# Patient Record
Sex: Female | Born: 1975 | Race: White | Hispanic: No | Marital: Married | Smoking: Never smoker
Health system: Southern US, Community
[De-identification: ages and names within clinical notes are randomized; demographics above are authoritative.]

## PROBLEM LIST (undated history)

## (undated) DIAGNOSIS — F419 Anxiety disorder, unspecified: Secondary | ICD-10-CM

## (undated) DIAGNOSIS — R51 Headache: Secondary | ICD-10-CM

## (undated) DIAGNOSIS — M545 Low back pain, unspecified: Secondary | ICD-10-CM

## (undated) DIAGNOSIS — F32A Depression, unspecified: Secondary | ICD-10-CM

## (undated) DIAGNOSIS — C801 Malignant (primary) neoplasm, unspecified: Secondary | ICD-10-CM

## (undated) DIAGNOSIS — G629 Polyneuropathy, unspecified: Secondary | ICD-10-CM

## (undated) HISTORY — DX: Low back pain, unspecified: M54.50

## (undated) HISTORY — DX: Headache: R51

## (undated) HISTORY — DX: Malignant (primary) neoplasm, unspecified: C80.1

## (undated) HISTORY — DX: Low back pain: M54.5

## (undated) HISTORY — DX: Polyneuropathy, unspecified: G62.9

## (undated) HISTORY — DX: Depression, unspecified: F32.A

## (undated) HISTORY — DX: Anxiety disorder, unspecified: F41.9

---

## 2001-01-06 ENCOUNTER — Other Ambulatory Visit: Admission: RE | Admit: 2001-01-06 | Discharge: 2001-01-06 | Payer: Self-pay | Admitting: Obstetrics and Gynecology

## 2002-02-03 ENCOUNTER — Other Ambulatory Visit: Admission: RE | Admit: 2002-02-03 | Discharge: 2002-02-03 | Payer: Self-pay | Admitting: Obstetrics and Gynecology

## 2003-03-24 ENCOUNTER — Other Ambulatory Visit: Admission: RE | Admit: 2003-03-24 | Discharge: 2003-03-24 | Payer: Self-pay | Admitting: Obstetrics and Gynecology

## 2004-05-29 ENCOUNTER — Inpatient Hospital Stay (HOSPITAL_COMMUNITY): Admission: AD | Admit: 2004-05-29 | Discharge: 2004-06-02 | Payer: Self-pay | Admitting: Obstetrics and Gynecology

## 2004-07-15 ENCOUNTER — Other Ambulatory Visit: Admission: RE | Admit: 2004-07-15 | Discharge: 2004-07-15 | Payer: Self-pay | Admitting: Obstetrics & Gynecology

## 2008-01-19 ENCOUNTER — Ambulatory Visit: Payer: Self-pay | Admitting: Family Medicine

## 2008-01-19 DIAGNOSIS — M542 Cervicalgia: Secondary | ICD-10-CM | POA: Insufficient documentation

## 2008-03-08 ENCOUNTER — Ambulatory Visit: Payer: Self-pay | Admitting: Family Medicine

## 2008-03-08 DIAGNOSIS — J019 Acute sinusitis, unspecified: Secondary | ICD-10-CM | POA: Insufficient documentation

## 2008-03-08 DIAGNOSIS — B358 Other dermatophytoses: Secondary | ICD-10-CM | POA: Insufficient documentation

## 2008-03-08 DIAGNOSIS — R21 Rash and other nonspecific skin eruption: Secondary | ICD-10-CM | POA: Insufficient documentation

## 2008-03-08 LAB — CONVERTED CEMR LAB: Rapid Strep: NEGATIVE

## 2008-04-10 ENCOUNTER — Ambulatory Visit (HOSPITAL_COMMUNITY): Admission: RE | Admit: 2008-04-10 | Discharge: 2008-04-10 | Payer: Self-pay | Admitting: Obstetrics and Gynecology

## 2009-10-08 ENCOUNTER — Ambulatory Visit: Payer: Self-pay | Admitting: Sports Medicine

## 2009-10-08 DIAGNOSIS — M545 Low back pain, unspecified: Secondary | ICD-10-CM | POA: Insufficient documentation

## 2009-10-08 DIAGNOSIS — R5381 Other malaise: Secondary | ICD-10-CM

## 2009-10-08 DIAGNOSIS — R5383 Other fatigue: Secondary | ICD-10-CM | POA: Insufficient documentation

## 2009-10-08 DIAGNOSIS — M79605 Pain in left leg: Secondary | ICD-10-CM

## 2009-10-08 DIAGNOSIS — M5416 Radiculopathy, lumbar region: Secondary | ICD-10-CM

## 2009-10-29 ENCOUNTER — Encounter: Admission: RE | Admit: 2009-10-29 | Discharge: 2009-12-03 | Payer: Self-pay | Admitting: Sports Medicine

## 2009-10-29 ENCOUNTER — Encounter: Payer: Self-pay | Admitting: Sports Medicine

## 2009-11-20 ENCOUNTER — Encounter: Payer: Self-pay | Admitting: Sports Medicine

## 2009-12-12 ENCOUNTER — Encounter (INDEPENDENT_AMBULATORY_CARE_PROVIDER_SITE_OTHER): Payer: Self-pay | Admitting: *Deleted

## 2010-03-12 ENCOUNTER — Ambulatory Visit
Admission: RE | Admit: 2010-03-12 | Discharge: 2010-03-12 | Payer: Self-pay | Source: Home / Self Care | Attending: Sports Medicine | Admitting: Sports Medicine

## 2010-04-02 NOTE — Letter (Signed)
Summary: [facility] PT Referral Form  [facility] PT Referral Form   Imported By: Marily Memos 10/08/2009 11:03:32  _____________________________________________________________________  External Attachment:    Type:   Image     Comment:   External Document

## 2010-04-02 NOTE — Miscellaneous (Signed)
Summary: [facility]  [facility]   Imported By: Marily Memos 12/04/2009 09:39:44  _____________________________________________________________________  External Attachment:    Type:   Image     Comment:   External Document

## 2010-04-02 NOTE — Assessment & Plan Note (Signed)
Summary: BACK INJURY,MC   Vital Signs:  Patient profile:   35 year old female Height:      66.5 inches Weight:      180 pounds BMI:     28.72 BP sitting:   113 / 77  Vitals Entered By: Lillia Pauls CMA (October 08, 2009 9:24 AM)  History of Present Illness: Ms. Sandra Rocha is a 35 year old female who  presents today with a 2 year history of lower back pain, which has gotten significantly worse in the past six months. She recalls an inciting event, in which she had to throw her dog over a fance, which caused significant low back pain. She describes her current pain as located in central lower back, with shooting pains radiating into the buttocks and posterior thigh.  Her pain is worse when sitting and laying on her stomach, and is often worse in the morning. She gets some relief from her chiropractor, ice, and Alieve. She reports numbness to touch of the left great toe, without any other parasthesias or weakness. She deneis any changes in bowel or bladder continence.  Of note, Ms. Vidrio has had a stressful year following the birth of her son (pregancy compliated by pre-eclampsia) who has Beckwith-Wiedmann syndrome, but is doing relatively well. She works as a IT trainer at [employer], so is seated for most of the day. She is trying to increase her physical activity to lose weight and is run/walking about 5-6 miles per week.  Allergies: No Known Drug Allergies  Physical Exam  General:  Well-developed female in no actue distress who moves slowly and with great intention. Msk:  Back: Tender to palpation in the low back over the spine and the paraspinal muscles. ROM: Full flexion, extension, rotation, and lateral bending with some pain on extension. Straight leg raise induces mild right sided back pain. Pearlean Brownie test reveals tightness without pain. SI joints move symmetrically with alternating pressure.  Hip: ABuctors 4/5 bilaterally. Left leg shows slight weakness compared to right on hip flexion and knee  extension. Patient is able to walk on toes and heels with no obvous differences between right and left.  Running and walking gait are normal, no trendelenburg, with obvious guarding of the pelvis and low-back from motion. Neurologic:  decreased sensation to light touch over left great toe, with normal gross sensation through legs.  Patellar and Achilles reflexes are 2+ and symmetrical bilaterally.   Impression & Recommendations:  Problem # 1:  LUMBAGO (ICD-724.2) Ms. Hentz presents with symptoms of chronic low back pain and clinical signs that are concerning for a discogenic etiology. As the patient is not having significant neuropathy at this point, surgery is not a consideration so we will refrain from imaging at this point. Instead, the patient can use Mobic for pain control, flexaril at night to relax her paraspinal muscles and allow her to rest, and will start physical therapy to help strengthen her muscles and return to comfortable physical activity. Her updated medication list for this problem includes:    Flexeril 5 Mg Tabs (Cyclobenzaprine hcl) .Marland Kitchen... Take 1-2 tabs by mouth at bedtime if needed    Mobic 7.5 Mg Tabs (Meloxicam) .Marland Kitchen... 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 We will address the pain with Mobic, and flexaril for muscle relaxation and sleep. The patient will also begin twice weekly physical therapy to improve her muscle strength to support the spine. The patient is to return to clinic in 6 weeks to re-evaluate.  Problem # 3:  WEAKNESS (ICD-780.79) The patient has a slight left sided hip and leg weakness that may be related to neuropathy or to changing activity patterns given pack  pain. The patient is to begin physical therapy twice per week to work to strengthen these muscles symmetrically. She will return to clinic after 6 weeks of PT to evaluate for improvement.  Complete Medication List: 1)  Flexeril 5 Mg Tabs (Cyclobenzaprine hcl) .... Take 1-2 tabs by mouth at bedtime if needed 2)  Aleve Cold & Sinus 120-220 Mg Xr12h-tab (Pseudoephedrine-naproxen na) 3)  Vicks Nyquil Multi-symptom 15-6.25-325 Mg Caps (Dm-doxylamine-acetaminophen) .... As directed 4)  Prenavite Multiple Vitamin 28-0.8 Mg Tabs (Prenatal vit-fe fumarate-fa) .Marland Kitchen.. 1 qd 5)  Cvs Magnesium 250 Mg Tabs (Magnesium) .Marland Kitchen.. 1 qd 6)  Vitamin C 250 Mg Tabs (Ascorbic acid) .Marland Kitchen.. 1 qd 7)  Augmentin 875-125 Mg Tabs (Amoxicillin-pot clavulanate) .Marland Kitchen.. 1 by mouth twice a day 8)  Nasacort Aq 55 Mcg/act Aers (Triamcinolone acetonide(nasal)) .Marland Kitchen.. 1-2 spr once daily 9)  Ayr Saline Nasal Drops 0.65 % Soln (Saline) .... As directed 10)  Zyrtec-d Allergy & Congestion 5-120 Mg Xr12h-tab (Cetirizine-pseudoephedrine) .Marland Kitchen.. 1 by mouth twice aday 11)  Diflucan 150 Mg Tabs (Fluconazole) .... Once daily use as needed 12)  Ketoconazole 2 % Crea (Ketoconazole) .... Apply to area twice a day  for 10 -14 days 13)  Maxair Autohaler 200 Mcg/inh Aerb (Pirbuterol acetate) .... 2 puffs q 6hrs as needed for sob 14)  Mobic 7.5 Mg Tabs (Meloxicam) .Marland Kitchen.. 1 by mouth qd Prescriptions: MOBIC 7.5 MG TABS (MELOXICAM) 1 by mouth qd  #30 x 3   Entered and Authorized by:   Enid Baas MD   Signed by:   Enid Baas MD on 10/08/2009   Method used:   Electronically to        [facility]* (retail)       [address]       [address] Shipping/mailing       [place], [place]  [address]       Ph: [phone]       Fax: [phone]   RxID:   [id number] FLEXERIL 5 MG TABS (CYCLOBENZAPRINE HCL) take 1-2 tabs by mouth at bedtime if needed  #60 x 3   Entered and Authorized by:   Enid Baas MD   Signed by:   Enid Baas MD on  10/08/2009   Method used:   Electronically to        [facility]* (retail)       [address]       [address] Shipping/mailing       [place], [place]  [address]       Ph: [phone]       Fax: [phone]   RxID:   [id number]

## 2010-04-02 NOTE — Miscellaneous (Signed)
Summary: Tramadol refill  Clinical Lists Changes  Medications: Rx of TRAMADOL HCL 50 MG TABS (TRAMADOL HCL) take one tab q 4-6 hours as needed pain;  #120 x 1;  Signed;  Entered by: Rochele Pages RN;  Authorized by: Enid Baas MD;  Method used: Electronically to [facility]*, [address], [address] Shipping/mailing, [place], [place]  [address], Ph: [phone], Fax: [phone]    Prescriptions: TRAMADOL HCL 50 MG TABS (TRAMADOL HCL) take one tab q 4-6 hours as needed pain  #120 x 1   Entered by:   Rochele Pages RN   Authorized by:   Enid Baas MD   Signed by:   Rochele Pages RN on 12/12/2009   Method used:   Electronically to        [facility]* (retail)       [address]       [address] Shipping/mailing       [place], [place]  [address]       Ph: [phone]       Fax: [phone]   RxID:   [id number]  Per Dr. Darrick Penna refilled.  Pt notified.  Rochele Pages RN  December 12, 2009 2:09 PM

## 2010-04-02 NOTE — Miscellaneous (Signed)
Summary: [facility]  [facility]   Imported By: Marily Memos 10/31/2009 08:52:22  _____________________________________________________________________  External Attachment:    Type:   Image     Comment:   External Document

## 2010-04-02 NOTE — Miscellaneous (Signed)
Summary: [facility]  [facility]   Imported By: Marily Memos 12/04/2009 09:40:27  _____________________________________________________________________  External Attachment:    Type:   Image     Comment:   External Document

## 2010-04-04 NOTE — Assessment & Plan Note (Signed)
Summary: F/U [facility]   Vital Signs:  Patient profile:   35 year old female Pulse rate:   98 / minute BP sitting:   110 / 74  (right arm)  Vitals Entered By: Rochele Pages RN (March 12, 2010 9:02 AM) CC: f/u low back pain   CC:  f/u low back pain.  History of Present Illness: Pt presents to clinic for f/u of low back pain which she reports is approx 25% improved.  States she has days where pain is improved, and some days it is about the same.  After completing PT sessions her pain showed great improvement, but is worsening again now that she has started to increase her running again.  She is up to 3-4 miles per week.  States she does not have back pain while running, but has it before and after runs.  She continues to have constant numbness in her L foot and great toe, this symptom never improved.  She takes 1-2 flexeril at bedtime, and tramadol which is helpful, she reports needing to take the max dose some days.    Preventive Screening-Counseling & Management  Alcohol-Tobacco     Smoking Status: never  Allergies (verified): No Known Drug Allergies  Social History: Smoking Status:  never  Physical Exam  General:  Well-developed,well-nourished,in no acute distress; alert,appropriate and cooperative throughout examination Msk:  FABER tight on Rt FABER on lt normal SI joint movement normal Straight leg raise neg Hip flexion tight on left, normal on rt Hip abduction strong on rt and lt Lumbar extension 20 degrees Lumbar flexion normal Rotation and lateral bending normal Toe walk normal Heel walk normal Tandum walk without problems Weak on great toe flexion on Lt Slightly weak on great toe extension on Lt     Impression & Recommendations:  Problem # 1:  LUMBAGO (ICD-724.2)  Her updated medication list for this problem includes:    Flexeril 5 Mg Tabs (Cyclobenzaprine hcl) .Marland Kitchen... Take 1-2 tabs by mouth at bedtime if needed    Mobic 7.5 Mg Tabs (Meloxicam) .Marland Kitchen... 1 by  mouth qd    Tramadol Hcl 50 Mg Tabs (Tramadol hcl) .Marland Kitchen... 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 Take 1-2 tabs by mouth at bedtime if needed 2)  Aleve Cold & Sinus 120-220 Mg Xr12h-tab (Pseudoephedrine-naproxen na) 3)  Vicks Nyquil Multi-symptom 15-6.25-325 Mg Caps (Dm-doxylamine-acetaminophen) .... As directed 4)  Prenavite Multiple Vitamin 28-0.8 Mg Tabs (Prenatal vit-fe fumarate-fa) .Marland Kitchen.. 1 qd 5)  Cvs Magnesium 250 Mg Tabs (Magnesium) .Marland Kitchen.. 1 qd 6)  Vitamin C 250 Mg Tabs (Ascorbic acid) .Marland Kitchen.. 1 qd 7)  Augmentin 875-125 Mg Tabs (Amoxicillin-pot clavulanate) .Marland Kitchen.. 1 by mouth twice a day 8)  Nasacort Aq 55 Mcg/act Aers (Triamcinolone acetonide(nasal)) .Marland Kitchen.. 1-2 spr once daily 9)  Ayr Saline Nasal Drops 0.65 % Soln (Saline) .... As directed 10)  Zyrtec-d Allergy & Congestion 5-120 Mg Xr12h-tab (Cetirizine-pseudoephedrine) .Marland Kitchen.. 1 by mouth twice aday 11)  Diflucan 150 Mg Tabs (Fluconazole) .... Once daily use as needed 12)  Ketoconazole 2 % Crea (Ketoconazole) .... Apply to area  twice a day  for 10 -14 days 13)  Maxair Autohaler 200 Mcg/inh Aerb (Pirbuterol acetate) .... 2 puffs q 6hrs as needed for sob 14)  Mobic 7.5 Mg Tabs (Meloxicam) .Marland Kitchen.. 1 by mouth qd 15)  Tramadol Hcl 50 Mg Tabs (Tramadol hcl) .... Take one tab q 4-6 hours as needed pain 16)  Gabapentin 300 Mg Caps (Gabapentin) .... Take 1-2 at bedtime  Patient Instructions: 1)  Do suggested exercises from the low back exercise sheet daily 2)  Do push ups 3)  Ok to continue  walking/running for exercise, be careful with downhill courses  Prescriptions: TRAMADOL HCL 50 MG TABS (TRAMADOL HCL) take one tab q 4-6 hours as needed pain  #120 x 1   Entered and Authorized by:   Enid Baas MD   Signed by:   Enid Baas MD on 03/12/2010   Method used:   Electronically to        [facility]* (retail)       [address] [place], [place]  [address]       Ph: [phone]       Fax: [phone]   RxID:   [id number] GABAPENTIN 300 MG CAPS (GABAPENTIN) take 1-2 at bedtime  #60 x 3   Entered and Authorized by:   Enid Baas MD   Signed by:   Enid Baas MD on 03/12/2010   Method used:   Electronically to        [facility]* (retail)       [address] [place], [place]  [address]       Ph: [phone]       Fax: [phone]   RxID:   [id number]    Orders Added: 1)  Diagnostic X-Ray/Fluoroscopy [Diagnostic X-Ray/Flu] 2)  Est. Patient Level III [[address]]

## 2010-04-05 ENCOUNTER — Other Ambulatory Visit: Payer: Self-pay | Admitting: Sports Medicine

## 2010-04-05 ENCOUNTER — Ambulatory Visit
Admission: RE | Admit: 2010-04-05 | Discharge: 2010-04-05 | Disposition: A | Discharge status: Home / Self Care | Payer: BC Managed Care – PPO | Source: Ambulatory Visit | Attending: Sports Medicine | Admitting: Sports Medicine

## 2010-04-05 DIAGNOSIS — M545 Low back pain: Secondary | ICD-10-CM

## 2010-06-03 ENCOUNTER — Other Ambulatory Visit: Payer: Self-pay | Admitting: *Deleted

## 2010-06-03 MED ORDER — TRAMADOL HCL 50 MG PO TABS: ORAL_TABLET | ORAL | Status: DC

## 2010-06-17 ENCOUNTER — Ambulatory Visit: Payer: BC Managed Care – PPO | Admitting: Sports Medicine

## 2010-07-05 ENCOUNTER — Ambulatory Visit (INDEPENDENT_AMBULATORY_CARE_PROVIDER_SITE_OTHER): Payer: BC Managed Care – PPO | Admitting: Family Medicine

## 2010-07-05 ENCOUNTER — Encounter: Payer: Self-pay | Admitting: Family Medicine

## 2010-07-05 DIAGNOSIS — M545 Low back pain: Secondary | ICD-10-CM

## 2010-07-05 DIAGNOSIS — M5137 Other intervertebral disc degeneration, lumbosacral region: Secondary | ICD-10-CM

## 2010-07-05 MED ORDER — GABAPENTIN 300 MG PO CAPS: ORAL_CAPSULE | ORAL | Status: DC

## 2010-07-05 MED ORDER — TRAMADOL HCL 50 MG PO TABS: ORAL_TABLET | ORAL | Status: DC

## 2010-07-05 NOTE — Progress Notes (Signed)
  Subjective:    Patient ID: Sandra Rocha, female    DOB: 1975/03/04, 35 y.o.   MRN: [id number]  HPI Patient is here today to followup on her low back pain which she says is about 25% better from the last visit. 2 weeks ago she did say that she heard a pop when she bent over to pick up her son in her low back area. She has been doing home and formal physical therapy in the previously. The patient had spoke about possibly seeking chiropractic therapy at some point from now on. Patient is still taking tramadol daily which seems to have helped some. Patient still doesn't have many days where she doesn't have any pain at all.   Review of Systems     Objective:   Physical Exam        Assessment & Plan:

## 2010-07-07 NOTE — Assessment & Plan Note (Signed)
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

## 2010-07-07 NOTE — Progress Notes (Signed)
  Subjective:    Patient ID: Sandra Rocha, female    DOB: 04-13-1975, 35 y.o.   MRN: [id number]  HPI Patient is here today to followup on her low back pain which she says is about 25% better from the last visit.  She has been doing home exercises, has done formal PT in the past. Still taking tramadol daily which seems to help - has cut down to 1-2 tabs/day from 4 or more tabs daily.  Is running 4-5 miles a week with less pain.  Continues to have numbness in her L foot & great toe.  Taking gabapentin which is helpful.  Denies any change in bowel or bladder, denies saddle anesthesia.   Review of Systems Per HPI, otherwise negative    Objective:   Physical Exam GEN: AOx3, NAD, Pleasant SKIN: no rashes or lesions MSK:  - Back: ROM with normal flexion without pain, extension 20-deg with pain, normal rotation & sidebending.  No midline tenderness, mild TTP paraspinal muscles L4-5 b/l.  Good SI-joint mobility.  Neg SLR b/l.  Neg FABER b/l.  Able to toe walk & heel walk.  Mild weakness of left great toe extension. - Hips: Good strength with abduction & adduction, good ROM without pain NEURO: sensation intact to light touch, DTR +2/4 achilles & patella b/l      Assessment & Plan:

## 2010-07-07 NOTE — Assessment & Plan Note (Addendum)
-   Continues to improve - Reviewed x-rays from 04/2010 with her today which showed transitional vertebrae in lumbar spine - Cont tramadol prn - cont to wean as able - Cont gabapentin  - Continue home exercises - Cont walk/running program - ok to increase slowly as pain allows - follow-up 2-3 months, may return sooner if needed

## 2010-07-19 NOTE — Op Note (Signed)
NAMEARYAH, DOERING              ACCOUNT NO.:  [id number]   MEDICAL RECORD NO.:  [id number]          PATIENT TYPE:  INP   LOCATION:  9139                          FACILITY:  [facility]   PHYSICIAN:  Gerrit Friends. Aldona Bar, M.D.   DATE OF BIRTH:  [birth year]   DATE OF PROCEDURE:  05/30/2004  DATE OF DISCHARGE:                                 OPERATIVE REPORT   PREOPERATIVE DIAGNOSES:  1.  A 42-week intrauterine pregnancy.  2.  Failed induction attempt.  3.  Meconium-stained amniotic fluid.   POSTOPERATIVE DIAGNOSES:  1.  A 42-week intrauterine pregnancy.  2.  Failed induction attempt.  3.  Meconium-stained amniotic fluid.  4.  Delivery of 9 pounds 7 ounces female infant, Apgars 8 and 9.   PROCEDURE:  Primary low transverse cesarean section.   SURGEON:  Dr. Aldona Bar   ANESTHESIA:  Epidural.   HISTORY:  This patient, a gravida 2, para 0, was admitted on the evening of  March 29 for scheduled induction as was 42 weeks' gestation.  She received  one dose of Cytotec and when seen by me on the morning of March 30, her  cervix was closed, 50% effaced, with a vertex at -3 station.  She measured  39 cm.  Fetal heart was reactive.  Pitocin was begun, and the patient  progressed to almost 1 cm dilatation.  I was able to rupture her membranes,  and thick, meconium-stained fluid was noted.  After a discussion with the  patient and her husband, decision was made to abandon the induction attempt  as the vertex was still -2 at best and proceed with delivery by cesarean  section.   The patient was taken to the operating room where epidural was augmented and  having been placed in a supine position and slightly tilted left, was  prepped and draped in the usual fashion for abdominal surgery.   A Foley catheter had been inserted prior to her arrival in the labor and  delivery area.   Once good epidural anesthesia was documented and after the patient had been  adequately draped, the procedure was begun.  A  Pfannenstiel incision was  made with minimal difficulty, dissected down to and through the fascia in a  low transverse fashion with hemostasis created at each layer.  Subfascial  space was created inferiorly and superiorly and muscles separated in the  midline, peritoneum identified and entered appropriately with care taken to  avoid the bowel superiorly and the bladder inferiorly.   The vesicouterine peritoneum was identified,incised in a low transverse  fashion, pushed off the lower uterine segment with ease, and then sharp  incision to the uterus with Metzenbaum scissors was carried out in a low  transverse fashion.  The vertex was still floating.  With the aid of the  vacuum extractor, delivery of a viable female infant which cried  spontaneously (after being DeLee'd) was carried out from the vertex  presentation.  The infant was DeLee'd with minimal reduction of fluid and  after the cord was clamped and cut and the infant was passed off to the  awaiting  team headed up by Dr. Katrinka Blazing, and ultimately the baby was taken to  the nursery in good condition.  Her subsequent weight was found to be 9  pounds 7 ounces, and Apgars were assigned at 8 and 9.   After the cord bloods were collected, the placenta was delivered intact.  At  this time, the uterus was exteriorized, rendered free of any remaining  products conception, and good uterine contractility was afforded with slowly  given intravenous Pitocin and manual stimulation.  Closure of the uterine  incision was then carried out using a single layer of #1 Vicryl in a running  locking fashion.  Hemostasis was excellent.  The bladder flap was then  reapproximated using 3-0 Vicryl in a running fashion.  Tubes and ovaries  were inspected and noted to be normal.  The uterus was well contracted.  Hemostasis adequate.  At this time, the uterus was re-placed into the  abdomen, and the abdomen was lavaged of all free blood and clot.  At this  time,  closure of the abdomen was begun after all counts were noted to be  correct and no foreign bodies were noted to be remaining in the abdominal  cavity.   The abdominal peritoneum was closed with 0 Vicryl in a running fashion and  muscle secured with same.  Assured of good subfascial hemostasis, the fascia  was then reapproximated using the 0 Vicryl angled to midline bilaterally.  Subcutaneous tissue was rendered hemostatic, and staples were then used to  close the skin.  A Sterile pressure dressing was applied.  The patient at  this time was transported to the recovery room in satisfactory condition  having tolerated the procedure well.  As mentioned, all counts correct x2.  Estimated blood loss 700 mL.   In summary, this patient was admitted for induction at 51 weeks' gestation  on the evening of March 29.  On the morning of March 20, when first examined  by me, her cervix was closed; vertex was -3; Pitocin was begun, and the  patient progressed to approximately fingertip to 1 cm after about 6 hours of  Pitocin, and the vertex descended to about -2 station.  Amniotomy was  carried out with production of meconium-stained fluid and after discussion  with the patient and her husband, the decision was made to proceed with low  transverse cesarean section and abandon the induction attempt.  She was  delivered of a 9 pounds 7 ounces female infant with Apgars of 8 and 9 and at  the conclusion of the procedure, both mother and baby were doing well in  their respective recovery areas.      RMW/MEDQ  D:  05/30/2004  T:  05/30/2004  Job:  [id number]

## 2010-07-19 NOTE — Discharge Summary (Signed)
NAMEJENI, Sandra Rocha              ACCOUNT NO.:  [id number]   MEDICAL RECORD NO.:  [id number]          PATIENT TYPE:  INP   LOCATION:  9139                          FACILITY:  [facility]   PHYSICIAN:  Miguel Aschoff, M.D.       DATE OF BIRTH:  [birth year]   DATE OF ADMISSION:  05/29/2004  DATE OF DISCHARGE:  06/02/2004                                 DISCHARGE SUMMARY   FINAL DIAGNOSES:  1.  Intrauterine pregnancy at [redacted] weeks gestation.  2.  Failed induction attempt.  3.  Meconium-stained amniotic fluid.   PROCEDURE:  Primary low transverse cesarean section. Surgeon:  Dr. Annamaria Helling. Complications:  None.   This 35 year old G2 P0 presents at [redacted] weeks gestation for an induction of  labor. The patient received a dose of Cytotec on the evening of May 29, 2004 and when she was seen on the morning of March 30 by Dr. Aldona Bar her cervix  was still closed, 50% effaced, as a -3 station. Fetal heart tones were  reactive, Pitocin was begun, and the patient progressed to about 1 cm. AROM  was performed with thick meconium-stained fluid and after another check the  vertex was at the best a -2 station. A discussion was held with the patient  and her husband. A decision was made to proceed with a cesarean section. The  patient was taken to the operating room on May 30, 2004 by Dr. Annamaria Helling  where a primary low transverse cesarean section was performed with the  delivery of a 9-pound 7-ounce female infant with Apgars of 8 and 9. Delivery  went without complications. The patient's postoperative course was benign  without significant fevers. She was felt ready for discharge on  postoperative day #3. She was sent home on a regular diet, told to decrease  activities, told to continue her prenatal vitamins and her Chromagen iron  supplement one daily, was given Tylox one q.3-4h. as needed for pain, was to  follow up in the office in 4 weeks.   LABORATORY DATA ON DISCHARGE:  The patient had a hemoglobin of  9.5, white  blood cell count of 12.4. Postoperatively, the patient did receive her  rubella vaccination for nonimmune status before discharge.      MB/MEDQ  D:  07/01/2004  T:  07/01/2004  Job:  [id number]

## 2010-08-09 ENCOUNTER — Encounter: Payer: Self-pay | Admitting: *Deleted

## 2010-08-09 MED ORDER — TRAMADOL HCL 50 MG PO TABS: 50.0000 mg | ORAL_TABLET | Freq: Four times a day (QID) | ORAL | Status: DC | PRN

## 2010-08-09 NOTE — Progress Notes (Unsigned)
  Subjective:    Patient ID: Sandra Rocha, female    DOB: 1976/02/15, 35 y.o.   MRN: [id number]  HPI Pt called today to inform us that she need another refill on tramadol because she has increased the frequency due to the new pain in here back from lifting a 20lb bag of sand for the kids sandpit. Has been taking them qid. Will call in rf with qid directions since that does has helped out a lot.     Review of Systems     Objective:   Physical Exam        Assessment & Plan:

## 2010-08-27 ENCOUNTER — Ambulatory Visit (INDEPENDENT_AMBULATORY_CARE_PROVIDER_SITE_OTHER): Payer: BC Managed Care – PPO | Admitting: Family Medicine

## 2010-08-27 ENCOUNTER — Encounter: Payer: Self-pay | Admitting: Family Medicine

## 2010-08-27 VITALS — BP 116/80 | HR 91 | Ht 67.0 in | Wt 177.0 lb

## 2010-08-27 DIAGNOSIS — M545 Low back pain: Secondary | ICD-10-CM

## 2010-08-27 MED ORDER — TRAMADOL HCL 50 MG PO TABS: 50.0000 mg | ORAL_TABLET | Freq: Four times a day (QID) | ORAL | Status: DC | PRN

## 2010-08-27 NOTE — Assessment & Plan Note (Addendum)
Much improved from initial exam.  No findings to indicate need for MRI or further work-up at this time. Discussed continuance of lower back exercises, stretching, and core muscle strengthening.  Refilled Tramadol for occasional pain relief. Continue gabapentin to help with numbness to left great toe. Ok to continue advancing running activity as tolerated. Follow-up as needed, would like to be seen in 2-3 months at least in the short term to make sure she is progressing. Encouraged to call with questions or concerns.

## 2010-08-27 NOTE — Progress Notes (Signed)
  Subjective:    Patient ID: Sandra Rocha, female    DOB: 1975/04/26, 35 y.o.   MRN: [id number]  HPI  35 yo F returns for lumbago:  1.  Lumbago:  Present since January.  States it is much improved since then, doing daily exercise routine (body weight calisthenics) and running about 3 miles weekly.  Previously unable to run due to pain.  However, 3 weeks ago she had a set-back when she attempted to lift 20 lb bag of sand for son's sandbox.  Heard a "pop" and unable to straighten for several days.  Was taking Tramadol once daily prior to that setback, however afterwards she has needed to take 4 times daily.  Pain is improved today from 3 weeks ago & starting to use less tramadol.  No paresthesias.  Does have some numbness in Left great toe.  No weakness BL LE's.  No bladder or bowel incontinence.      Review of Systems See HPI, also no fevers or weight loss    Objective:   Physical Exam  Gen:  Alert, cooperative patient who appears stated age in no acute distress.  Vital signs reviewed. MSK:  Back with minimal TTP S1 joint.  Otherwise nontender.  Good flexion, extension.  Able to walk on tiptoes and on heels.  Good ROM BL LE's, strength 5/5 BL LE's.  Neg SLR bilaterally.  Mild weakness of left great toe extension.  Hips with full ROM without pain & good strength. Neuro:  Great toe with diminished sensation, sensation otherwise grossly normal throughout.  LE reflexes +2/4 achilles & patella BL.   Does not walk with limp or other gait abnormality CV:  Distal BL pulses +2       Assessment & Plan:

## 2010-08-27 NOTE — Progress Notes (Signed)
  Subjective:    Patient ID: Sandra Rocha, female    DOB: 1975-06-18, 36 y.o.   MRN: [id number]  HPI 35 yo F returns for lumbago:  1.  Lumbago:  Present since January.  States it is much improved since then, doing daily exercise routine (body weight calisthenics) and running about 3 miles weekly.  Previously unable to run due to pain.  However, 3 weeks ago she a set-back when she attempted to lift 20 lb bag of sand for son's sandbox.  Heard a "pop" and unable to straighten for several days.  Was taking Tramadol once daily if that prior to setback, however afterwards she has needed to take 4 times daily.  Pain is improved today from 3 weeks ago.  No paresthesias.  Does have some numbness in Left great toe.  No weakness BL LE's.  No bladder or bowel incontinence.      Review of Systems See HPI, also no fevers or weight loss    Objective:   Physical Exam Gen:  Alert, cooperative patient who appears stated age in no acute distress.  Vital signs reviewed. MSK:  Back with minimal TTP S1 joint.  Otherwise nontender.  Good flexion, extension.  Able to walk on tiptoes and on heels.  Good ROM BL LE's, strength 5/5 BL LE's.  Hips with full ROM without pain.   Neuro:  Great toe with diminished sensation, sensation otherwise grossly normal throughout.  LE reflexes +2 BL.   Does not walk with limp or other gait abnormality CV:  Distal BL pulses +2       Assessment & Plan:

## 2010-11-12 ENCOUNTER — Other Ambulatory Visit: Payer: Self-pay | Admitting: *Deleted

## 2010-11-12 MED ORDER — TRAMADOL HCL 50 MG PO TABS: 50.0000 mg | ORAL_TABLET | Freq: Four times a day (QID) | ORAL | Status: DC | PRN

## 2010-11-26 ENCOUNTER — Ambulatory Visit: Payer: BC Managed Care – PPO | Admitting: Sports Medicine

## 2010-12-25 ENCOUNTER — Ambulatory Visit (INDEPENDENT_AMBULATORY_CARE_PROVIDER_SITE_OTHER): Payer: BC Managed Care – PPO | Admitting: Sports Medicine

## 2010-12-25 ENCOUNTER — Encounter: Payer: Self-pay | Admitting: Sports Medicine

## 2010-12-25 VITALS — BP 106/72 | HR 93

## 2010-12-25 DIAGNOSIS — M545 Low back pain, unspecified: Secondary | ICD-10-CM

## 2010-12-25 MED ORDER — CYCLOBENZAPRINE HCL 5 MG PO TABS: 5.0000 mg | ORAL_TABLET | Freq: Three times a day (TID) | ORAL | Status: DC | PRN

## 2010-12-25 MED ORDER — TRAMADOL-ACETAMINOPHEN 37.5-325 MG PO TABS: 1.0000 | ORAL_TABLET | Freq: Four times a day (QID) | ORAL | Status: DC | PRN

## 2010-12-25 NOTE — Patient Instructions (Signed)
Great to meet you. MRI of her spine. Change to her tramadol to Ultracet. Low dose Flexeril. Come back to see [place] after her MRI is done, to discuss results.  Sandra Rocha. Sandra Rocha, M.D. [facility] [address] [place] [address] [phone]

## 2010-12-25 NOTE — Progress Notes (Signed)
Pt scheduled for appt for MRI 02/10/11 @ 9 am at [address].

## 2010-12-25 NOTE — Progress Notes (Addendum)
  Subjective:    Patient ID: Sandra Rocha, female    DOB: 1975-05-02, 35 y.o.   MRN: [id number]  HPI Low back pain: 35 year old female with a history of lumbar transitional vertebra, with low back pain since 2011. Notes no particular injuries, but has been having pain chronically. She also has pain that goes down her left leg into her toes. She's been treated her several times, has been through approximately 12 sessions of formal physical therapy, has never had a prednisone burst, and using gabapentin which helps, sleeping okay, is having approximately 2 very painful days per month, and the rest of the days are only somewhat painful. She denies any fevers, chills, bowel or bladder incontinence.  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 Review of Systems    no fevers, chills, night sweats, weight loss. Objective:   Physical Exam General: Well-developed, well-nourished Caucasian female in no acute distress. Neuro: Alert and oriented x3, extraocular muscles intact. Skin: Warm and dry. Respiratory: Not using accessory muscles. MSK: No discrete tenderness to palpation on the paraspinal musculature, or midline. Range of motion good flexion extension sidebending and rotation. Strength 5 out of 5 to all movements of the lower extremities, sensation intact to all dermatomes of the lower extremity except the great toe, as well as first web space on the left side. Deep tendon reflexes 2+ the patellar, Achilles, and downgoing Babinski's bilaterally. Positive slump test with reproduction of radicular symptoms in the S1 distribution on the left.     Assessment & Plan:  1. Low back pain: Symptoms suggestive of an S1 radiculopathy on the left side. She's failed conservative therapy, and is only 25% better since her initial visit. She's also failed formal physical therapy for 12 sessions. At this point I think we will need to get an MRI to confirm neuroforaminal impingement, versus  disc bulge, versus facet hypertrophy versus ligamentum flavum hypertrophy. If the MRI is suggestive of neuroforaminal impingement we will send the patient to [facility] orthopedics for epidural corticosteroid injection to confirm diagnosis. After a long discussion she did say that she would consider surgery if it would help her pain. I am adding Ultracet and refilled her Flexeril.

## 2011-01-07 ENCOUNTER — Other Ambulatory Visit: Payer: Self-pay | Admitting: *Deleted

## 2011-01-07 MED ORDER — TRAMADOL HCL 50 MG PO TABS: 50.0000 mg | ORAL_TABLET | Freq: Four times a day (QID) | ORAL | Status: AC | PRN

## 2011-01-20 ENCOUNTER — Ambulatory Visit: Payer: BC Managed Care – PPO | Admitting: Sports Medicine

## 2011-02-10 ENCOUNTER — Inpatient Hospital Stay: Admission: RE | Admit: 2011-02-10 | Payer: BC Managed Care – PPO | Source: Ambulatory Visit

## 2011-02-14 ENCOUNTER — Ambulatory Visit
Admission: RE | Admit: 2011-02-14 | Discharge: 2011-02-14 | Disposition: A | Discharge status: Home / Self Care | Payer: BC Managed Care – PPO | Source: Ambulatory Visit | Attending: Sports Medicine | Admitting: Sports Medicine

## 2011-02-14 DIAGNOSIS — M545 Low back pain: Secondary | ICD-10-CM

## 2011-02-19 ENCOUNTER — Telehealth: Payer: Self-pay | Admitting: Sports Medicine

## 2011-02-19 MED ORDER — GABAPENTIN 300 MG PO CAPS: ORAL_CAPSULE | ORAL | Status: DC

## 2011-02-19 NOTE — Telephone Encounter (Addendum)
Called pt- left VM to return my call re: MRI results and med change.  Pt has f/u appt w/ Dr. Karie Schwalbe 03/11/11.

## 2011-02-19 NOTE — Telephone Encounter (Signed)
Please call pt and let her know I increased gabapentin to 300mg  2 tabs qHS.  I would like to see her to re-examine her and go over options. Thanks!

## 2011-02-21 ENCOUNTER — Other Ambulatory Visit: Payer: Self-pay | Admitting: *Deleted

## 2011-02-21 DIAGNOSIS — M545 Low back pain, unspecified: Secondary | ICD-10-CM

## 2011-02-21 MED ORDER — TRAMADOL-ACETAMINOPHEN 37.5-325 MG PO TABS: 1.0000 | ORAL_TABLET | Freq: Four times a day (QID) | ORAL | Status: DC | PRN

## 2011-02-21 MED ORDER — GABAPENTIN 600 MG PO TABS: 600.0000 mg | ORAL_TABLET | Freq: Every day | ORAL | Status: DC

## 2011-03-05 ENCOUNTER — Other Ambulatory Visit: Payer: Self-pay | Admitting: *Deleted

## 2011-03-05 MED ORDER — TRAMADOL HCL 50 MG PO TABS: 50.0000 mg | ORAL_TABLET | Freq: Four times a day (QID) | ORAL | Status: DC | PRN

## 2011-03-05 NOTE — Progress Notes (Signed)
Refilled pt's tramadol per Dr. Darrick Penna.

## 2011-03-11 ENCOUNTER — Ambulatory Visit (INDEPENDENT_AMBULATORY_CARE_PROVIDER_SITE_OTHER): Payer: BC Managed Care – PPO | Admitting: Sports Medicine

## 2011-03-11 VITALS — BP 122/74

## 2011-03-11 DIAGNOSIS — M5137 Other intervertebral disc degeneration, lumbosacral region: Secondary | ICD-10-CM

## 2011-03-11 DIAGNOSIS — M51379 Other intervertebral disc degeneration, lumbosacral region without mention of lumbar back pain or lower extremity pain: Secondary | ICD-10-CM

## 2011-03-11 DIAGNOSIS — M545 Low back pain, unspecified: Secondary | ICD-10-CM

## 2011-03-11 MED ORDER — TRAMADOL-ACETAMINOPHEN 37.5-325 MG PO TABS: 1.0000 | ORAL_TABLET | Freq: Four times a day (QID) | ORAL | Status: DC | PRN

## 2011-03-11 NOTE — Patient Instructions (Addendum)
Great to see you Sandra Rocha, Sending you to [facility], Dr. Regino Schultze and Dr. Yevette Edwards for epidural steroid injection.  You have been scheduled for an appointment with Dr. Regino Schultze at [facility] for 03/17/11 at 8:45am.  Their office is located at [facility], and phone number is [phone].  [name]. [name], M.D. [facility] [address], [place] [address] [phone]

## 2011-03-11 NOTE — Assessment & Plan Note (Signed)
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 Failed formal PT. No sufficient relief with neurontin and ultracet. Increase neurontin daily. Referral to [facility], Dr. Regino Schultze and [facility] for epidural injection. RTC 4 weeks.

## 2011-03-11 NOTE — Progress Notes (Addendum)
  Subjective:    Patient ID: Sandra Rocha, female    DOB: [birth year], 36 y.o.   MRN: [id number]  HPI  Low back pain: 36 year old female with low back pain since 2011. Notes no particular injuries, but has been having pain chronically. She has pain that goes down her left leg into her toes, in an S1 distribution. She has been through approximately 12 sessions of formal physical therapy, using gabapentin and ultracet which helps, sleeping okay, is having approximately 2 very painful days per month, and the rest of the days are only somewhat painful.  Having failed conservative therapy I performed an MRI at the last visit, which showed a left-sided L5-S1 bulge with what appears to be mild neuroforaminal impingement on my independent review of the MRI.  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   Review of Systems    She denies any fevers, chills, bowel or bladder incontinence.  Objective:   Physical Exam General: Well-developed, well-nourished Caucasian female in no acute distress. Neuro: Alert and oriented x3, extraocular muscles intact. Skin: Warm and dry. Respiratory: Not using accessory muscles. MSK: No discrete tenderness to palpation on the paraspinal musculature, or midline.  Range of motion good flexion extension sidebending and rotation.  Strength 5 out of 5 to all movements of the lower extremities, sensation intact to all dermatomes of the lower extremity except the great toe, as well as first web space on the left side. Deep tendon reflexes 2+ the patellar, Achilles, and downgoing Babinski's bilaterally.  Positive slump test with reproduction of radicular symptoms in the S1 distribution on the left.     Assessment & Plan:  1. Low back pain: Symptoms suggestive of an S1 radiculopathy on the left side with confirmation of a left-sided L5-S1 disc bulge based on MRI. She's failed conservative therapy, and is only 25% better since her initial visit. She's  also failed formal physical therapy for 12 sessions.  I will send the patient to [facility] orthopedics for selective epidural corticosteroid injection to confirm diagnosis. After a long discussion she did say that she would consider surgery if it would help her pain. She will continue her Neurontin up taper, and continue her Ultracet.

## 2011-03-12 ENCOUNTER — Other Ambulatory Visit: Payer: Self-pay | Admitting: Family Medicine

## 2011-03-12 ENCOUNTER — Other Ambulatory Visit (HOSPITAL_COMMUNITY)
Admission: RE | Admit: 2011-03-12 | Discharge: 2011-03-12 | Disposition: A | Discharge status: Home / Self Care | Payer: BC Managed Care – PPO | Source: Ambulatory Visit | Attending: Family Medicine | Admitting: Family Medicine

## 2011-03-12 DIAGNOSIS — Z124 Encounter for screening for malignant neoplasm of cervix: Secondary | ICD-10-CM | POA: Insufficient documentation

## 2011-04-29 ENCOUNTER — Telehealth: Payer: Self-pay | Admitting: *Deleted

## 2011-04-29 ENCOUNTER — Other Ambulatory Visit: Payer: Self-pay | Admitting: *Deleted

## 2011-04-29 MED ORDER — TRAMADOL HCL 50 MG PO TABS: 50.0000 mg | ORAL_TABLET | Freq: Four times a day (QID) | ORAL | Status: DC | PRN

## 2011-04-29 NOTE — Telephone Encounter (Signed)
Pt states she had her 1st ESI at L5 done by Dr. Regino Schultze 04/15/11 improved significantly after that.  Now pain is starting to return in low back and down leg.  Scheduled for next [facility] 05/13/11 with Dr. Regino Schultze.  She requests a refill on tramadol.  Per Dr. Karie Schwalbe refill sent.  He advises that she keep [place] updated, but does not need a f/u at this time.

## 2011-04-29 NOTE — Telephone Encounter (Signed)
Message copied by Mora Bellman on Tue Apr 29, 2011 11:06 AM ------      Message from: Lizbeth Bark      Created: Tue Apr 29, 2011  9:41 AM      Regarding: phone message      Contact: [phone]       Pt would like a call back regarding the injections at [facility] ortho,meds, does she need a f/u with Dr. Karie Schwalbe?

## 2011-07-29 ENCOUNTER — Other Ambulatory Visit: Payer: Self-pay | Admitting: *Deleted

## 2011-07-29 MED ORDER — TRAMADOL HCL 50 MG PO TABS: 50.0000 mg | ORAL_TABLET | Freq: Four times a day (QID) | ORAL | Status: DC | PRN

## 2011-07-29 MED ORDER — GABAPENTIN 600 MG PO TABS: 600.0000 mg | ORAL_TABLET | Freq: Every day | ORAL | Status: DC

## 2011-11-20 ENCOUNTER — Encounter: Payer: Self-pay | Admitting: Sports Medicine

## 2011-11-20 ENCOUNTER — Ambulatory Visit (INDEPENDENT_AMBULATORY_CARE_PROVIDER_SITE_OTHER): Payer: BC Managed Care – PPO | Admitting: Sports Medicine

## 2011-11-20 VITALS — BP 133/83 | HR 96 | Ht 67.0 in | Wt 170.0 lb

## 2011-11-20 DIAGNOSIS — IMO0002 Reserved for concepts with insufficient information to code with codable children: Secondary | ICD-10-CM

## 2011-11-20 DIAGNOSIS — M5416 Radiculopathy, lumbar region: Secondary | ICD-10-CM

## 2011-11-20 MED ORDER — GABAPENTIN 600 MG PO TABS: ORAL_TABLET | ORAL | Status: DC

## 2011-11-20 MED ORDER — TRAMADOL HCL 50 MG PO TABS: 50.0000 mg | ORAL_TABLET | Freq: Four times a day (QID) | ORAL | Status: DC | PRN

## 2011-11-20 NOTE — Patient Instructions (Addendum)
Thank you for coming in today. Had a half a pill of gabapentin to lunch while continuing 2 pills at night.  Over the next few weeks advance to 1/2 pill at breakfast and lunch while keeping the 2 at night.  Continue the tramadol as needed.  Come back in a few months if all is well.  If you are too sedated we can make some other changes such as substituting Cymbalta for Celexa or trying lyrica.  Come back or go to the emergency room if you notice new weakness new numbness problems walking or bowel or bladder problems.

## 2011-11-20 NOTE — Assessment & Plan Note (Addendum)
Chronic and related to her bulging disc.   Currently undergoing conservative therapy with physical therapy trial Epidural corticosteroids - some but not great benefit Medication management - she would like to push this a bit harder  Plans to reconsider third epidural corticosteroid injection.  Additionally continue tramadol.  Increase gabapentin to 300 mg at breakfast and lunch and 1200 mg at dinner.  Followup in 6-8 week  There is one fragment on my review that might be amenable to surgical removal Advised that this is an option if medications and conservative care do not do enough Discussed warning signs or symptoms. Please see discharge instructions. Patient expresses understanding.

## 2011-11-20 NOTE — Progress Notes (Signed)
Sandra Rocha is a 36 y.o. female who presents to [facility] today for followup low back pain.  Patient has lumbar pain with left leg radiculopathy present for quite some time.  She's had a lumbar MRI which does show bulging of the L5 L6/S1 disc with compression of the cord.  She is currently undergoing a trial of guided epidural corticosteroid injection.  Additionally she is taking tramadol twice a day as needed along with 1200 mg of gabapentin at night.  Additionally she has had a trial of physical therapy.  This regimen has resulted in overall decreasing of her level of pain. She still has bad days but feels better overall.  She denies any weakness but does note numbness in her left great toe which is chronic and radiating pain down her left leg.  She denies any bowel or bladder dysfunction. She is running 10 miles per week.   Relief with epidural injections for only 2 weeks Gets similar relief from medications except for first few days No difference after 2nd injection and wonders if she needs a third   PMH reviewed.  History  Substance Use Topics  . Smoking status: Never Smoker   . Smokeless tobacco: Never Used  . Alcohol Use: Not on file   works as a IT trainer ROS as above otherwise neg   Exam:  BP 133/83  Pulse 96  Ht 5\' 7"  (1.702 m)  Wt 170 lb (77.111 kg)  BMI 26.63 kg/m2 Gen: Well NAD MSK: Back: Nontender over spinal midline. Reflexes are equal bilaterally in knees and ankles.  Strength is intact. Gait is normal. Patient and get onto and off exam table by herself.   MRI of lumbar back from December 2012 reviewed.

## 2012-02-24 ENCOUNTER — Encounter: Payer: Self-pay | Admitting: Sports Medicine

## 2012-02-24 ENCOUNTER — Ambulatory Visit (INDEPENDENT_AMBULATORY_CARE_PROVIDER_SITE_OTHER): Payer: BC Managed Care – PPO | Admitting: Sports Medicine

## 2012-02-24 VITALS — BP 127/85 | HR 94 | Ht 67.0 in | Wt 170.0 lb

## 2012-02-24 DIAGNOSIS — M545 Low back pain: Secondary | ICD-10-CM

## 2012-02-24 MED ORDER — AMITRIPTYLINE HCL 25 MG PO TABS: 25.0000 mg | ORAL_TABLET | Freq: Every day | ORAL | Status: DC

## 2012-02-24 MED ORDER — HYDROCODONE-ACETAMINOPHEN 5-325 MG PO TABS: 1.0000 | ORAL_TABLET | Freq: Four times a day (QID) | ORAL | Status: DC | PRN

## 2012-02-24 NOTE — Patient Instructions (Addendum)
Try heat or ice on lower back  Try using thera wrap   Start amitriptyline 25 mg at bedtime   Vicodin as needed for pain during the day  Please follow up in 1 week  We hope you have a Merry Christmas!

## 2012-02-24 NOTE — Progress Notes (Signed)
  Subjective:    Patient ID: Sandra Rocha, female    DOB: [birth year], 36 y.o.   MRN: [id number]  HPI  Pt presents to clinic for evaluation of acute low back pain related to fall 02/22/12. Fell in bathroom while holding son, was unable to brace herself. Has had acute lower back pain since then. Increased pain shooting down legs since fall. Has HX of left side radicullopathy Now some sxs to both legs Taking tramadol for pain,  No weakness  Hurts to sit or to stand very much Easy walking is some relief Ice helps a litllte   Review of Systems     Objective:   Physical Exam :Patient is in some obvious pain Moves carefully and hurts to get to standing from sit or lie  Able to walk on heels and toes Straight leg raise negative  Tightness in upper back Lower back is stiff Tenderness to palpation over lower back Has sensation in lower legs ttp over SI joints, sacrum, central lumbar areas, both central buttocks     Assessment & Plan:

## 2012-02-24 NOTE — Assessment & Plan Note (Signed)
Hx of some radicular sxs to left leg but after this fall pain to both legs  No neuro derficit Obvious pain  Vicodin for 1 week Add amitriptyline at night - has not slept Continue same dose of gabapentin  Recheck in 1 week

## 2012-03-05 ENCOUNTER — Ambulatory Visit (INDEPENDENT_AMBULATORY_CARE_PROVIDER_SITE_OTHER): Payer: BC Managed Care – PPO | Admitting: Sports Medicine

## 2012-03-05 ENCOUNTER — Encounter: Payer: Self-pay | Admitting: Sports Medicine

## 2012-03-05 VITALS — BP 128/85 | HR 105 | Ht 67.0 in | Wt 170.0 lb

## 2012-03-05 DIAGNOSIS — M79605 Pain in left leg: Secondary | ICD-10-CM

## 2012-03-05 DIAGNOSIS — M545 Low back pain: Secondary | ICD-10-CM

## 2012-03-05 MED ORDER — TRAMADOL HCL 50 MG PO TABS: 50.0000 mg | ORAL_TABLET | Freq: Four times a day (QID) | ORAL | Status: DC | PRN

## 2012-03-05 MED ORDER — GABAPENTIN 600 MG PO TABS: ORAL_TABLET | ORAL | Status: DC

## 2012-03-05 MED ORDER — HYDROCODONE-ACETAMINOPHEN 5-500 MG PO TABS: 1.0000 | ORAL_TABLET | Freq: Three times a day (TID) | ORAL | Status: DC | PRN

## 2012-03-05 NOTE — Progress Notes (Signed)
Sandra Rocha is a 37 y.o. female who presents to [facility] today for followup low back pain.  Patient has a history of chronic low back pain with left leg radiculopathy previously treated with gabapentin and tramadol. However 2 weeks ago she fell and injured her back again. She was seen last week where she was treated with Vicodin, and amitriptyline in addition to her chronic gabapentin and tramadol.  In the interim she notes interval improvement in back pain. She denies bilateral radiculopathy weakness difficulty walking or bowel bladder dysfunction.  Additionally she denies any midline pain, and notes that her pain is typically on the left side of her low back and does not radiate significantly.  She additionally denies any fevers or chills weight loss or night sweats.    PMH reviewed. Chronic low back pain History  Substance Use Topics  . Smoking status: Never Smoker   . Smokeless tobacco: Never Used  . Alcohol Use: Not on file   ROS as above otherwise neg   Exam:  BP 128/85  Pulse 105  Ht 5\' 7"  (1.702 m)  Wt 170 lb (77.111 kg)  BMI 26.63 kg/m2 Gen: Well NAD MSK: Back: Nontender over spinal midline. Tender to palpation over the bilateral lumbar paraspinal muscles and left SI joint. Normal back flexion and reduced extension rotation and lateral flexion secondary to pain.  Negative straight leg raise test and Faber test bilaterally.  Reduced pretzel stretch on left SI joint.  Normal sensation bilateral lower extremities Reflexes in knees and ankles bilaterally are equal and brisk Strength is preserved in hip flexion abduction and adduction knee flexion extension ankle dorsiflexion and plantar flex.  Gait is preserved patient and get on and off the table by herself.

## 2012-03-05 NOTE — Patient Instructions (Addendum)
Thank you for coming in today. I refilled the vicodin, but please try to stop taking them over this weekend.  Try to continue your normal activity.  Try the stretch that we talked about.  Please come back as needed.  Come back or go to the emergency room if you notice new weakness new numbness problems walking or bowel or bladder problems.

## 2012-03-05 NOTE — Assessment & Plan Note (Signed)
Acute low back pain in the setting of chronic low back pain. She is improving but not improved completely.  Plan: Wean off of Vicodin, will refill for 20 tablets. Continue tramadol gabapentin and amitriptyline.  Gradual increase in activity including low back exercises.  Followup if not improved in 4 weeks Discussed warning signs or symptoms. Please see discharge instructions. Patient expresses understanding.

## 2012-06-23 ENCOUNTER — Other Ambulatory Visit (HOSPITAL_COMMUNITY)
Admission: RE | Admit: 2012-06-23 | Discharge: 2012-06-23 | Disposition: A | Discharge status: Home / Self Care | Payer: BC Managed Care – PPO | Source: Ambulatory Visit | Attending: Family Medicine | Admitting: Family Medicine

## 2012-06-23 ENCOUNTER — Other Ambulatory Visit: Payer: Self-pay | Admitting: Family Medicine

## 2012-06-23 DIAGNOSIS — Z124 Encounter for screening for malignant neoplasm of cervix: Secondary | ICD-10-CM | POA: Insufficient documentation

## 2012-06-23 DIAGNOSIS — Z1151 Encounter for screening for human papillomavirus (HPV): Secondary | ICD-10-CM | POA: Insufficient documentation

## 2012-07-06 ENCOUNTER — Other Ambulatory Visit: Payer: Self-pay | Admitting: *Deleted

## 2012-07-06 MED ORDER — GABAPENTIN 600 MG PO TABS: ORAL_TABLET | ORAL | Status: DC

## 2012-07-06 MED ORDER — TRAMADOL HCL 50 MG PO TABS: 50.0000 mg | ORAL_TABLET | Freq: Four times a day (QID) | ORAL | Status: DC | PRN

## 2012-08-04 ENCOUNTER — Ambulatory Visit: Payer: BC Managed Care – PPO | Admitting: Sports Medicine

## 2012-09-02 ENCOUNTER — Ambulatory Visit (INDEPENDENT_AMBULATORY_CARE_PROVIDER_SITE_OTHER): Payer: BC Managed Care – PPO | Admitting: Sports Medicine

## 2012-09-02 ENCOUNTER — Encounter: Payer: Self-pay | Admitting: Sports Medicine

## 2012-09-02 VITALS — BP 127/87 | HR 87 | Ht 67.0 in | Wt 170.0 lb

## 2012-09-02 DIAGNOSIS — M545 Low back pain: Secondary | ICD-10-CM

## 2012-09-02 NOTE — Progress Notes (Signed)
Patient ID: Sandra Rocha, female   DOB: September 15, 1975, 37 y.o.   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  Taking tramadol and gabapentin QID These have made a difference with much less sxs  Radiating sxs to left leg Numbness to left great toe This is about 10 days per month  Pain level average day is 3 to 4/10  About 3  "severe days" per month  Exercise regimen - Still runs but less Walks 2 miles at time 4 x week No weight lifting  Examination  No acute distress  Straight leg raise is negative bilaterally Good strength on testing eversion and inversion dorsiflexion and great toe dorsiflexion On the left foot there is some weakness on great toe flexion  Standing she has normal lateral bending and rotation  She feels some low back pain on greater than 90 of flexion and it 25 of back extension  ,toe,heel and tandem walk are all normal  Feet demonstrate cavus change bilaterally Shows a widened forefoot and the transverse arch on the right is completely collapsed There is abnormal callusing on the ball of the foot bilaterally The transverse arch on the left foot is partially collapsed Fifth toe shows subluxation at the MTP joint bilaterally  Gait is supinated

## 2012-09-02 NOTE — Patient Instructions (Addendum)
You have a lot of transverse arch collapse  We should make you orthotics to take pressure off feet  For the back try to walk 30 mins per day most days  We will get running started after orthotics made  Keep up back exercises  No change in medicine at this time but we may cut down  set up appt for orthotics

## 2012-09-02 NOTE — Assessment & Plan Note (Signed)
This is currently in better control but she still has pain 10 days per month She has limitation of her normal activities 3 days per month She has foot pain and increased radiculopathy particularly on the left with running  See her patient instructions We will plan to make her custom orthotics because I think the foot breakdown may be increasing distress to her back it is associated with an abnormally supinated gait

## 2012-09-22 ENCOUNTER — Encounter: Payer: Self-pay | Admitting: Emergency Medicine

## 2012-09-22 ENCOUNTER — Ambulatory Visit (INDEPENDENT_AMBULATORY_CARE_PROVIDER_SITE_OTHER): Payer: BC Managed Care – PPO | Admitting: Emergency Medicine

## 2012-09-22 VITALS — BP 123/80 | HR 80 | Ht 67.0 in | Wt 170.0 lb

## 2012-09-22 DIAGNOSIS — R269 Unspecified abnormalities of gait and mobility: Secondary | ICD-10-CM | POA: Insufficient documentation

## 2012-09-22 NOTE — Progress Notes (Signed)
Patient ID: Sandra Rocha, female   DOB: 07-22-1975, 37 y.o.   MRN: [id number] This is a 37 year old female who presents to the sports medicine clinic today for evaluation for orthotics.  This Polasek has been seen in the sports medicine clinic in the past for persistent back pain she is an active runner and  has decreased her running of late.    Past medical family and social history were reviewed on chart Review of systems systems is negative today  Exam This is a 37 year old female in no acute distress alert oriented  Foot exam reveals a wide forefoot suggestive of moderate arch collapse with historically high arch.  Today reveals moderate pronation with her gait  Patient was fitted for a : semi-rigid orthotic. The orthotic was heated and afterward the patient stood on the orthotic blank positioned on the orthotic stand. The patient was positioned in subtalar neutral position and 10 degrees of ankle dorsiflexion in a weight bearing stance. After completion of molding, a stable base was applied to the orthotic blank. The blank was ground to a stable position for weight bearing. Base: semirigid Posting: none Additional orthotic padding: none  After molding of the orthotics was complete the patient was instructed to run in the clinic with the orthotics in place to confirm adequate molding comfort with the orthotics.   45 Minutes of face-to-face time was involved in this  Impression 37 year old runner with chronic back pain and evidence of collapsing arch who presents for orthotic placement today.  Plan Patient was fitted for orthotics today and will followup in one to 2 months if any issues arise.

## 2012-10-28 ENCOUNTER — Other Ambulatory Visit: Payer: Self-pay | Admitting: *Deleted

## 2012-10-28 MED ORDER — GABAPENTIN 600 MG PO TABS: ORAL_TABLET | ORAL | Status: DC

## 2012-10-28 MED ORDER — TRAMADOL HCL 50 MG PO TABS: 50.0000 mg | ORAL_TABLET | Freq: Four times a day (QID) | ORAL | Status: DC | PRN

## 2013-02-22 ENCOUNTER — Telehealth: Payer: Self-pay | Admitting: *Deleted

## 2013-02-22 MED ORDER — GABAPENTIN 600 MG PO TABS: ORAL_TABLET | ORAL | Status: DC

## 2013-02-22 MED ORDER — TRAMADOL HCL 50 MG PO TABS: 50.0000 mg | ORAL_TABLET | Freq: Four times a day (QID) | ORAL | Status: DC | PRN

## 2013-02-22 NOTE — Telephone Encounter (Signed)
Message copied by Mora Bellman on Tue Feb 22, 2013  2:34 PM ------      Message from: Claiborne Billings      Created: Tue Feb 22, 2013 11:45 AM      Regarding: Refill Request       Needs refill of Gabapentin and Tramadol.  Current dosing is working well.  To [facility] in [employer]. ------

## 2013-03-17 ENCOUNTER — Encounter: Payer: Self-pay | Admitting: Sports Medicine

## 2013-03-17 ENCOUNTER — Ambulatory Visit (INDEPENDENT_AMBULATORY_CARE_PROVIDER_SITE_OTHER): Payer: BC Managed Care – PPO | Admitting: Sports Medicine

## 2013-03-17 VITALS — Ht 67.0 in | Wt 170.0 lb

## 2013-03-17 DIAGNOSIS — M5416 Radiculopathy, lumbar region: Secondary | ICD-10-CM

## 2013-03-17 DIAGNOSIS — M542 Cervicalgia: Secondary | ICD-10-CM

## 2013-03-17 DIAGNOSIS — IMO0002 Reserved for concepts with insufficient information to code with codable children: Secondary | ICD-10-CM

## 2013-03-17 NOTE — Patient Instructions (Signed)
Try applying heat or cold   Ok to try aspercream massage into area that is painful in your neck  Easy neck motion- do not push past pain  Arm swings- 15-20 reps several times per day  Continue low back pain exercises  Please follow up 3-4 months   Thank you for seeing us today!

## 2013-03-17 NOTE — Progress Notes (Signed)
   Subjective:    Patient ID: Sandra ChesterCynthia D Gonet, female    DOB: Nov 01, 1975, 38 y.o.   MRN: [id number]  HPI  Pt presents to clinic for f/u of lower back pain which is stable.  Well controlled on gabapentin 2400 mg per day and tramadol 200 mg total per day.  Also consistently does home exercises for lower back. Jogs for exercise also.  Having new lt side neck pain worse for the past 2 days. Pain radiates up into base of skull on lt and down into left shoulder, feels like a burning. Has had trouble sleeping the past 2 nights due to neck pain.  Pt recently was promoted to [employer].   Review of Systems     Objective:   Physical Exam NAD Ht 5\' 7"  (1.702 m)  Wt 170 lb (77.111 kg)  BMI 26.62 kg/m2  Neck pain with rotation and lateral bend to both sides Lt upper trap to superior neck tight C5-T1 strength normal  All neuro tests normal  Gait and low back movement today are pain free       Assessment & Plan:

## 2013-03-17 NOTE — Assessment & Plan Note (Signed)
Controlled on fairly high dose medicatio  Doing her HEP  Keep this up  No change in medication  Reck 3 to 4 mos

## 2013-03-17 NOTE — Assessment & Plan Note (Signed)
Given HEP Ice or heat  I suspect stress may be a trigger  Careful w computer position/ phone

## 2013-05-12 ENCOUNTER — Other Ambulatory Visit: Payer: Self-pay | Admitting: *Deleted

## 2013-05-12 MED ORDER — TRAMADOL HCL 50 MG PO TABS: 50.0000 mg | ORAL_TABLET | Freq: Four times a day (QID) | ORAL | Status: DC | PRN

## 2013-09-05 ENCOUNTER — Other Ambulatory Visit: Payer: Self-pay | Admitting: *Deleted

## 2013-09-05 MED ORDER — TRAMADOL HCL 50 MG PO TABS: 50.0000 mg | ORAL_TABLET | Freq: Four times a day (QID) | ORAL | Status: DC | PRN

## 2013-10-03 ENCOUNTER — Encounter (HOSPITAL_COMMUNITY): Payer: Self-pay | Admitting: Emergency Medicine

## 2013-10-03 ENCOUNTER — Emergency Department (HOSPITAL_COMMUNITY)
Admission: EM | Admit: 2013-10-03 | Discharge: 2013-10-04 | Disposition: A | Discharge status: Home / Self Care | Payer: BC Managed Care – PPO | Attending: Emergency Medicine | Admitting: Emergency Medicine

## 2013-10-03 DIAGNOSIS — F3289 Other specified depressive episodes: Secondary | ICD-10-CM | POA: Diagnosis not present

## 2013-10-03 DIAGNOSIS — F32A Depression, unspecified: Secondary | ICD-10-CM

## 2013-10-03 DIAGNOSIS — F1010 Alcohol abuse, uncomplicated: Secondary | ICD-10-CM | POA: Diagnosis present

## 2013-10-03 DIAGNOSIS — F411 Generalized anxiety disorder: Secondary | ICD-10-CM

## 2013-10-03 DIAGNOSIS — F1120 Opioid dependence, uncomplicated: Secondary | ICD-10-CM | POA: Diagnosis not present

## 2013-10-03 DIAGNOSIS — F1910 Other psychoactive substance abuse, uncomplicated: Secondary | ICD-10-CM

## 2013-10-03 DIAGNOSIS — F332 Major depressive disorder, recurrent severe without psychotic features: Secondary | ICD-10-CM

## 2013-10-03 DIAGNOSIS — F1994 Other psychoactive substance use, unspecified with psychoactive substance-induced mood disorder: Secondary | ICD-10-CM

## 2013-10-03 DIAGNOSIS — F329 Major depressive disorder, single episode, unspecified: Secondary | ICD-10-CM

## 2013-10-03 DIAGNOSIS — Z79899 Other long term (current) drug therapy: Secondary | ICD-10-CM | POA: Diagnosis not present

## 2013-10-03 LAB — COMPREHENSIVE METABOLIC PANEL
ALBUMIN: 4.2 g/dL (ref 3.5–5.2)
ALT: 34 U/L (ref 0–35)
AST: 24 U/L (ref 0–37)
Alkaline Phosphatase: 78 U/L (ref 39–117)
Anion gap: 14 (ref 5–15)
BILIRUBIN TOTAL: 0.2 mg/dL — AB (ref 0.3–1.2)
BUN: 20 mg/dL (ref 6–23)
CHLORIDE: 104 meq/L (ref 96–112)
CO2: 22 mEq/L (ref 19–32)
Calcium: 9 mg/dL (ref 8.4–10.5)
Creatinine, Ser: 0.74 mg/dL (ref 0.50–1.10)
GFR calc Af Amer: >90 mL/min (ref >90)
GFR calc non Af Amer: >90 mL/min (ref >90)
Glucose, Bld: 139 mg/dL — ABNORMAL HIGH (ref 70–99)
Potassium: 4.3 mEq/L (ref 3.7–5.3)
SODIUM: 140 meq/L (ref 137–147)
Total Protein: 8 g/dL (ref 6.0–8.3)

## 2013-10-03 LAB — CBC
HCT: 41.6 % (ref 36.0–46.0)
Hemoglobin: 14.1 g/dL (ref 12.0–15.0)
MCH: 30.5 pg (ref 26.0–34.0)
MCHC: 33.9 g/dL (ref 30.0–36.0)
MCV: 90 fL (ref 78.0–100.0)
PLATELETS: 390 10*3/uL (ref 150–400)
RBC: 4.62 MIL/uL (ref 3.87–5.11)
RDW: 12.5 % (ref 11.5–15.5)
WBC: 12.1 10*3/uL — AB (ref 4.0–10.5)

## 2013-10-03 LAB — RAPID URINE DRUG SCREEN, HOSP PERFORMED
Amphetamines: NOT DETECTED
BARBITURATES: NOT DETECTED
BENZODIAZEPINES: NOT DETECTED
Cocaine: NOT DETECTED
OPIATES: POSITIVE — AB
TETRAHYDROCANNABINOL: NOT DETECTED

## 2013-10-03 LAB — ACETAMINOPHEN LEVEL

## 2013-10-03 LAB — SALICYLATE LEVEL: Salicylate Lvl: <2 mg/dL — ABNORMAL LOW (ref 2.8–20.0)

## 2013-10-03 LAB — ETHANOL: Alcohol, Ethyl (B): <11 mg/dL (ref 0–11)

## 2013-10-03 MED ORDER — NORETHIN-ETH ESTRAD-FE BIPHAS 1 MG-10 MCG / 10 MCG PO TABS
1.0000 | ORAL_TABLET | Freq: Every day | ORAL | Status: DC
Start: 2013-10-03 — End: 2013-10-04
  Administered 2013-10-04: 1 via ORAL

## 2013-10-03 MED ORDER — ATENOLOL 25 MG PO TABS
25.0000 mg | ORAL_TABLET | Freq: Every day | ORAL | Status: DC
  Administered 2013-10-03 – 2013-10-04 (×2): 25 mg via ORAL
  Filled 2013-10-03 (×2): qty 1

## 2013-10-03 MED ORDER — ONDANSETRON HCL 4 MG PO TABS: 4.0000 mg | ORAL_TABLET | Freq: Three times a day (TID) | ORAL | Status: DC | PRN

## 2013-10-03 MED ORDER — ACETAMINOPHEN 325 MG PO TABS: 650.0000 mg | ORAL_TABLET | ORAL | Status: DC | PRN

## 2013-10-03 MED ORDER — SUMATRIPTAN SUCCINATE 100 MG PO TABS: 100.0000 mg | ORAL_TABLET | ORAL | Status: DC | PRN

## 2013-10-03 MED ORDER — HYDROXYZINE HCL 25 MG PO TABS: 25.0000 mg | ORAL_TABLET | Freq: Four times a day (QID) | ORAL | Status: DC | PRN

## 2013-10-03 MED ORDER — GABAPENTIN 300 MG PO CAPS
600.0000 mg | ORAL_CAPSULE | Freq: Three times a day (TID) | ORAL | Status: DC
  Administered 2013-10-03 – 2013-10-04 (×3): 600 mg via ORAL
  Filled 2013-10-03 (×3): qty 2

## 2013-10-03 MED ORDER — BUPROPION HCL ER (XL) 300 MG PO TB24
300.0000 mg | ORAL_TABLET | Freq: Every day | ORAL | Status: DC
  Administered 2013-10-03 – 2013-10-04 (×2): 300 mg via ORAL
  Filled 2013-10-03 (×2): qty 1

## 2013-10-03 MED ORDER — ESCITALOPRAM OXALATE 10 MG PO TABS
10.0000 mg | ORAL_TABLET | Freq: Every day | ORAL | Status: DC
  Administered 2013-10-03 – 2013-10-04 (×2): 10 mg via ORAL
  Filled 2013-10-03 (×2): qty 1

## 2013-10-03 MED ORDER — IBUPROFEN 200 MG PO TABS: 600.0000 mg | ORAL_TABLET | Freq: Three times a day (TID) | ORAL | Status: DC | PRN

## 2013-10-03 MED ORDER — ALPRAZOLAM 0.5 MG PO TABS
0.5000 mg | ORAL_TABLET | Freq: Every day | ORAL | Status: DC
  Administered 2013-10-03 – 2013-10-04 (×2): 0.5 mg via ORAL
  Filled 2013-10-03 (×2): qty 1

## 2013-10-03 NOTE — ED Provider Notes (Signed)
CSN: [id number]     Arrival date & time 10/03/13  1243 History   First MD Initiated Contact with Patient 10/03/13 1527     Chief Complaint  Patient presents with  . detox    HPI Comments: Patient is a 38 y.o. Female who presents to the ED with complaints of alcohol problems, opiate addiction, and depression.  Patient states that over the last couple months patient states that she has had worsening depression which she feels is contributing to her alcohol and opiate use.  Patient states that last week she decided that she was no longer going to take her lexapro because she though that this might fix her mood.  Patient states that she is currently drinking approximately 4 bottles of wine per week.  She does not drink every day but when she does she feels that she binge drinks.  Patient is also concerned that she has developed an addiction to opiate medications.  They were originally prescribed to her for chronic low back pain and migraines but the patient states that she has gotten to the point where she cannot live without them and she even stole oral dilaudid from her cousin.  Patient's husband states that she was confronted about stealing the medication and at that time she said "I didn't really care whether I woke up or not."  She is currently denying any suicidal ideations at this time or a plan.  Patient denies HI or plan.  She states that her son is also really sick and is having issues with her parents.  She denies hallucinations.    The history is provided by the patient. No language interpreter was used.    History reviewed. No pertinent past medical history. Past Surgical History  Procedure Laterality Date  . Cesarean section     History reviewed. No pertinent family history. History  Substance Use Topics  . Smoking status: Never Smoker   . Smokeless tobacco: Never Used  . Alcohol Use: Yes   OB History   Grav Para Term Preterm Abortions TAB SAB Ect Mult Living                  Review of Systems    Allergies  Review of patient's allergies indicates no known allergies.  Home Medications   Prior to Admission medications   Medication Sig Start Date End Date Taking? Authorizing Provider  ALPRAZolam Prudy Feeler) 0.5 MG tablet Take 0.5 mg by mouth daily. 01/08/12  Yes Historical Provider, MD  atenolol (TENORMIN) 25 MG tablet Take 25 mg by mouth daily. 03/07/13  Yes Historical Provider, MD  buPROPion (WELLBUTRIN XL) 300 MG 24 hr tablet Take 300 mg by mouth daily. 03/07/13  Yes Historical Provider, MD  escitalopram (LEXAPRO) 10 MG tablet Take 10 mg by mouth daily. 03/07/13  Yes Historical Provider, MD  gabapentin (NEURONTIN) 600 MG tablet Take 1 pill in the morning, 1 pill midday, and 2 pills at night. 02/22/13  Yes Enid Baas, MD  HYDROmorphone (DILAUDID) 2 MG tablet Take 2 mg by mouth every 4 (four) hours as needed for severe pain.   Yes Historical Provider, MD  hydrOXYzine (ATARAX/VISTARIL) 25 MG tablet Take 25 mg by mouth as needed. 03/07/13  Yes Historical Provider, MD  ketorolac (TORADOL) 10 MG tablet Take 10 mg by mouth daily as needed. 03/07/13  Yes Historical Provider, MD  LO LOESTRIN FE 1 MG-10 MCG / 10 MCG tablet Take 1 tablet by mouth daily. 03/07/13  Yes Historical Provider, MD  LORZONE 750 MG TABS Take 750 mg by mouth at bedtime as needed. 03/07/13  Yes Historical Provider, MD  naproxen sodium (ANAPROX) 220 MG tablet Take 440 mg by mouth every 6 (six) hours as needed.   Yes Historical Provider, MD  promethazine (PHENERGAN) 25 MG tablet Take 25 mg by mouth as needed. For severe migraine 02/07/13  Yes Historical Provider, MD  QUEtiapine (SEROQUEL) 25 MG tablet Take 25 mg by mouth as needed. For severe migraine 03/07/13  Yes Historical Provider, MD  SUMAtriptan (IMITREX) 100 MG tablet Take 100 mg by mouth as needed. For migraine 02/20/13  Yes Historical Provider, MD  traMADol (ULTRAM) 50 MG tablet Take 1 tablet (50 mg total) by mouth every 6 (six) hours as needed. 09/05/13  Yes  Enid Baas, MD  traMADol-acetaminophen (ULTRACET) 37.5-325 MG per tablet Take 1 tablet by mouth every 6 (six) hours as needed for pain. 03/11/11 03/21/11  Monica Becton, MD   BP 130/74  Pulse 96  Temp(Src) 98.3 F (36.8 C) (Oral)  Resp 16  SpO2 100%  LMP 10/03/2013 Physical Exam  Nursing note and vitals reviewed. Constitutional: She is oriented to person, place, and time. She appears well-developed and well-nourished. No distress.  HENT:  Head: Normocephalic and atraumatic.  Mouth/Throat: Oropharynx is clear and moist. No oropharyngeal exudate.  Eyes: Conjunctivae and EOM are normal. Pupils are equal, round, and reactive to light. No scleral icterus.  Neck: Normal range of motion. Neck supple. No JVD present. No thyromegaly present.  Cardiovascular: Normal rate, regular rhythm, normal heart sounds and intact distal pulses.  Exam reveals no gallop and no friction rub.   No murmur heard. Pulmonary/Chest: Effort normal and breath sounds normal. No respiratory distress. She has no wheezes. She has no rales. She exhibits no tenderness.  Abdominal: Soft. Bowel sounds are normal. She exhibits no distension and no mass. There is no tenderness. There is no rebound and no guarding.  Musculoskeletal: Normal range of motion.  Lymphadenopathy:    She has no cervical adenopathy.  Neurological: She is alert and oriented to person, place, and time. No cranial nerve deficit. Coordination normal.  Skin: Skin is warm and dry. No rash noted. She is not diaphoretic. No erythema. No pallor.  Psychiatric: She has a normal mood and affect. Her behavior is normal. Judgment and thought content normal.    ED Course  Procedures (including critical care time) Labs Review Labs Reviewed  CBC - Abnormal; Notable for the following:    WBC 12.1 (*)    All other components within normal limits  COMPREHENSIVE METABOLIC PANEL - Abnormal; Notable for the following:    Glucose, Bld 139 (*)    Total Bilirubin  0.2 (*)    All other components within normal limits  SALICYLATE LEVEL - Abnormal; Notable for the following:    Salicylate Lvl <2.0 (*)    All other components within normal limits  URINE RAPID DRUG SCREEN (HOSP PERFORMED) - Abnormal; Notable for the following:    Opiates POSITIVE (*)    All other components within normal limits  ACETAMINOPHEN LEVEL  ETHANOL    Imaging Review No results found.   EKG Interpretation None      MDM   Final diagnoses:  Depression  Alcohol abuse  Uncomplicated opioid dependence   Patient is a 38 y.o. Female who presents to the ED with multiple complaints of alcohol, opiate dependence, and worsening depression.  She is actively seeking help.  Basic labs were drawn here without  acute abnormality.  Patient is opiate positive at this time with a negative alcohol level.  Patient is medically cleared at this time.  Patient to be seen by TTS at this time for further help with medication and disposition.  Patient has been moved to the Psych ED at this time.      Eben Burowourtney A Forcucci, PA-C 10/03/13 1842

## 2013-10-03 NOTE — Consult Note (Signed)
[facility] Consult   Reason for Consult:  Detox Referring Physician:  EDP Sandra Rocha is an 38 y.o. female. Total Time spent with patient: 25 minutes  Assessment: AXIS I:  Alcohol Abuse, Generalized Anxiety Disorder, Major Depression, Recurrent severe, Substance Abuse and Substance Induced Mood Disorder AXIS II:  Deferred AXIS III:  History reviewed. No pertinent past medical history. AXIS IV:  other psychosocial or environmental problems and problems related to social environment AXIS V:  51-60 moderate symptoms  Plan:  No evidence of imminent risk to self or others at present.   Patient does not meet criteria for psychiatric inpatient admission. Supportive therapy provided about ongoing stressors. Refer to IOP. Discussed crisis plan, support from social network, calling 911, coming to the Emergency Department, and calling Suicide Hotline. Pt to be observed overnight and re-evaluated in AM for possible IOP and other outpatient resources for depression, coping, and substance abuse.   Subjective:   Sandra Rocha is a 38 y.o. female patient admitted with complaints of alcohol and substance abuse. Spoke with this pt and her husband and pt reports intermittent binge drinking but typically social drinking once or twice per week with "3-4 glasses of wine usually". Pt reports that her medication regimen was not working and that her chronic migraines were not relieved by the meds; pt also reports that her family has degenerative disc history and that her neurologist told her that she may need surgery soon in her lower back as well given her current condition; pt uses pain meds for same. Pt denies SI, HI, and AVH, contracts for safety, and reports that she was just overwhelmed and feels this way often. She reports that she has never stolen medication and that this is completely out of character but she did not know what to do after stopping the other medications suddenly without  medical supervision.   HPI:  Patient is a 38 y.o. Female who presents to the ED with complaints of alcohol problems, opiate addiction, and depression. Patient states that over the last couple months patient states that she has had worsening depression which she feels is contributing to her alcohol and opiate use. Patient states that last week she decided that she was no longer going to take her lexapro because she though that this might fix her mood. Patient states that she is currently drinking approximately 4 bottles of wine per week. She does not drink every day but when she does she feels that she binge drinks. Patient is also concerned that she has developed an addiction to opiate medications. They were originally prescribed to her for chronic low back pain and migraines but the patient states that she has gotten to the point where she cannot live without them and she even stole oral dilaudid from her cousin. Patient's husband states that she was confronted about stealing the medication and at that time she said "I didn't really care whether I woke up or not." She is currently denying any suicidal ideations at this time or a plan. Patient denies HI or plan. She states that her son is also really sick and is having issues with her parents. She denies hallucinations.   HPI Elements:   Location:  Generalized, [facility]. Quality:  Stable. Severity:  Severe. Timing:  Chronic. Duration:  Transient exacerbation. Context:  Pt stopped her regular medications and medicated self with alcohol and medications from another person when she realized she was having trouble coping in the absence of original meds.Marland Kitchen  Past Psychiatric History: History reviewed. No pertinent past medical history.  reports that she has never smoked. She has never used smokeless tobacco. She reports that she drinks alcohol. Her drug history is not on file. History reviewed. No pertinent family history.         Allergies:  No Known  Allergies  ACT Assessment Complete:  Yes:    Educational Status    Risk to Self: Risk to self with the past 6 months Is patient at risk for suicide?: No Substance abuse history and/or treatment for substance abuse?: Yes  Risk to Others:    Abuse:    Prior Inpatient Therapy:    Prior Outpatient Therapy:    Additional Information:                    Objective: Blood pressure 116/60, pulse 74, temperature 98.9 F (37.2 C), temperature source Oral, resp. rate 20, last menstrual period 10/03/2013, SpO2 100.00%.There is no weight on file to calculate BMI. Results for orders placed during the hospital encounter of 10/03/13 (from the past 72 hour(s))  URINE RAPID DRUG SCREEN (HOSP PERFORMED)     Status: Abnormal   Collection Time    10/03/13  2:13 PM      Result Value Ref Range   Opiates POSITIVE (*) NONE DETECTED   Cocaine NONE DETECTED  NONE DETECTED   Benzodiazepines NONE DETECTED  NONE DETECTED   Amphetamines NONE DETECTED  NONE DETECTED   Tetrahydrocannabinol NONE DETECTED  NONE DETECTED   Barbiturates NONE DETECTED  NONE DETECTED   Comment:            DRUG SCREEN FOR MEDICAL PURPOSES     ONLY.  IF CONFIRMATION IS NEEDED     FOR ANY PURPOSE, NOTIFY LAB     WITHIN 5 DAYS.                LOWEST DETECTABLE LIMITS     FOR URINE DRUG SCREEN     Drug Class       Cutoff (ng/mL)     Amphetamine      1000     Barbiturate      200     Benzodiazepine   701     Tricyclics       779     Opiates          300     Cocaine          300     THC              50  ACETAMINOPHEN LEVEL     Status: None   Collection Time    10/03/13  2:26 PM      Result Value Ref Range   Acetaminophen (Tylenol), Serum <15.0  10 - 30 ug/mL   Comment:            THERAPEUTIC CONCENTRATIONS VARY     SIGNIFICANTLY. A RANGE OF 10-30     ug/mL MAY BE AN EFFECTIVE     CONCENTRATION FOR MANY PATIENTS.     HOWEVER, SOME ARE BEST TREATED     AT CONCENTRATIONS OUTSIDE THIS     RANGE.     ACETAMINOPHEN  CONCENTRATIONS     >150 ug/mL AT 4 HOURS AFTER     INGESTION AND >50 ug/mL AT 12     HOURS AFTER INGESTION ARE     OFTEN ASSOCIATED WITH TOXIC     REACTIONS.  CBC  Status: Abnormal   Collection Time    10/03/13  2:26 PM      Result Value Ref Range   WBC 12.1 (*) 4.0 - 10.5 K/uL   RBC 4.62  3.87 - 5.11 MIL/uL   Hemoglobin 14.1  12.0 - 15.0 g/dL   HCT 41.6  36.0 - 46.0 %   MCV 90.0  78.0 - 100.0 fL   MCH 30.5  26.0 - 34.0 pg   MCHC 33.9  30.0 - 36.0 g/dL   RDW 12.5  11.5 - 15.5 %   Platelets 390  150 - 400 K/uL  COMPREHENSIVE METABOLIC PANEL     Status: Abnormal   Collection Time    10/03/13  2:26 PM      Result Value Ref Range   Sodium 140  137 - 147 mEq/L   Potassium 4.3  3.7 - 5.3 mEq/L   Chloride 104  96 - 112 mEq/L   CO2 22  19 - 32 mEq/L   Glucose, Bld 139 (*) 70 - 99 mg/dL   BUN 20  6 - 23 mg/dL   Creatinine, Ser 0.74  0.50 - 1.10 mg/dL   Calcium 9.0  8.4 - 10.5 mg/dL   Total Protein 8.0  6.0 - 8.3 g/dL   Albumin 4.2  3.5 - 5.2 g/dL   AST 24  0 - 37 U/L   ALT 34  0 - 35 U/L   Alkaline Phosphatase 78  39 - 117 U/L   Total Bilirubin 0.2 (*) 0.3 - 1.2 mg/dL   GFR calc non Af Amer >90  >90 mL/min   GFR calc Af Amer >90  >90 mL/min   Comment: (NOTE)     The eGFR has been calculated using the CKD EPI equation.     This calculation has not been validated in all clinical situations.     eGFR's persistently <90 mL/min signify possible Chronic Kidney     Disease.   Anion gap 14  5 - 15  ETHANOL     Status: None   Collection Time    10/03/13  2:26 PM      Result Value Ref Range   Alcohol, Ethyl (B) <11  0 - 11 mg/dL   Comment:            LOWEST DETECTABLE LIMIT FOR     SERUM ALCOHOL IS 11 mg/dL     FOR MEDICAL PURPOSES ONLY  SALICYLATE LEVEL     Status: Abnormal   Collection Time    10/03/13  2:26 PM      Result Value Ref Range   Salicylate Lvl <3.1 (*) 2.8 - 20.0 mg/dL   Labs are reviewed and are pertinent for UDS + opiates.  Current Facility-Administered  Medications  Medication Dose Route Frequency Provider Last Rate Last Dose  . acetaminophen (TYLENOL) tablet 650 mg  650 mg Oral Q4H PRN Courtney A Forcucci, PA-C      . ALPRAZolam Duanne Moron) tablet 0.5 mg  0.5 mg Oral Daily Courtney A Forcucci, PA-C   0.5 mg at 10/03/13 1659  . atenolol (TENORMIN) tablet 25 mg  25 mg Oral Daily Courtney A Forcucci, PA-C   25 mg at 10/03/13 1700  . buPROPion (WELLBUTRIN XL) 24 hr tablet 300 mg  300 mg Oral Daily Courtney A Forcucci, PA-C   300 mg at 10/03/13 1757  . escitalopram (LEXAPRO) tablet 10 mg  10 mg Oral Daily Courtney A Forcucci, PA-C   10 mg at 10/03/13 1659  .  gabapentin (NEURONTIN) capsule 600 mg  600 mg Oral TID Jamie Kato Forcucci, PA-C   600 mg at 10/03/13 2158  . hydrOXYzine (ATARAX/VISTARIL) tablet 25 mg  25 mg Oral Q6H PRN Courtney A Forcucci, PA-C      . ibuprofen (ADVIL,MOTRIN) tablet 600 mg  600 mg Oral Q8H PRN Courtney A Forcucci, PA-C      . Norethindrone-Ethinyl Estradiol-Fe Biphas (LO LOESTRIN FE) 1 MG-10 MCG / 10 MCG tablet 1 tablet  1 tablet Oral Daily Courtney A Forcucci, PA-C      . ondansetron (ZOFRAN) tablet 4 mg  4 mg Oral Q8H PRN Courtney A Forcucci, PA-C      . SUMAtriptan (IMITREX) tablet 100 mg  100 mg Oral PRN Cherylann Parr, PA-C       Current Outpatient Prescriptions  Medication Sig Dispense Refill  . ALPRAZolam (XANAX) 0.5 MG tablet Take 0.5 mg by mouth daily.      Marland Kitchen atenolol (TENORMIN) 25 MG tablet Take 25 mg by mouth daily.      Marland Kitchen buPROPion (WELLBUTRIN XL) 300 MG 24 hr tablet Take 300 mg by mouth daily.      Marland Kitchen escitalopram (LEXAPRO) 10 MG tablet Take 10 mg by mouth daily.      Marland Kitchen gabapentin (NEURONTIN) 600 MG tablet Take 1 pill in the morning, 1 pill midday, and 2 pills at night.  120 tablet  4  . HYDROmorphone (DILAUDID) 2 MG tablet Take 2 mg by mouth every 4 (four) hours as needed for severe pain.      . hydrOXYzine (ATARAX/VISTARIL) 25 MG tablet Take 25 mg by mouth as needed.      Marland Kitchen ketorolac (TORADOL) 10 MG tablet  Take 10 mg by mouth daily as needed.      . LO LOESTRIN FE 1 MG-10 MCG / 10 MCG tablet Take 1 tablet by mouth daily.      Marland Kitchen LORZONE 750 MG TABS Take 750 mg by mouth at bedtime as needed.      . naproxen sodium (ANAPROX) 220 MG tablet Take 440 mg by mouth every 6 (six) hours as needed.      . promethazine (PHENERGAN) 25 MG tablet Take 25 mg by mouth as needed. For severe migraine      . QUEtiapine (SEROQUEL) 25 MG tablet Take 25 mg by mouth as needed. For severe migraine      . SUMAtriptan (IMITREX) 100 MG tablet Take 100 mg by mouth as needed. For migraine      . traMADol (ULTRAM) 50 MG tablet Take 1 tablet (50 mg total) by mouth every 6 (six) hours as needed.  120 tablet  3  . traMADol-acetaminophen (ULTRACET) 37.5-325 MG per tablet Take 1 tablet by mouth every 6 (six) hours as needed for pain.  60 tablet  3    Psychiatric Specialty Exam:     Blood pressure 116/60, pulse 74, temperature 98.9 F (37.2 C), temperature source Oral, resp. rate 20, last menstrual period 10/03/2013, SpO2 100.00%.There is no weight on file to calculate BMI.  General Appearance: Casual  Eye Contact::  Good  Speech:  Clear and Coherent  Volume:  Normal  Mood:  Anxious  Affect:  Appropriate  Thought Process:  Coherent and Goal Directed  Orientation:  Full (Time, Place, and Person)  Thought Content:  WDL  Suicidal Thoughts:  No  Homicidal Thoughts:  No  Memory:  Immediate;   Good Recent;   Good Remote;   Good  Judgement:  Fair  Insight:  Fair  Psychomotor Activity:  Normal  Concentration:  Good  Recall:  Good  Fund of [place]  Language: Fair  Akathisia:  No  Handed:  Right  AIMS (if indicated):     Assets:  Communication Skills Desire for Improvement Financial Resources/Insurance Housing Leisure Time [place] Talents/Skills Transportation Vocational/Educational  Sleep:      Musculoskeletal: Strength & Muscle Tone: within normal limits Gait &  Station: normal Patient leans: N/A  Treatment Plan Summary: Daily contact with patient to assess and evaluate symptoms and progress in treatment Medication management *Observe overnight for stabilization. Consider discharge to IOP in AM. along with outpatient resources for addiction.  Benjamine Mola, FNP-BC 10/03/2013 5:55 PM

## 2013-10-03 NOTE — ED Notes (Signed)
Patient's husband reported that the patient had taken 60- 2 mg Dilaudid tabs in 5 days.

## 2013-10-03 NOTE — ED Notes (Addendum)
Pt requesting detox from ETOH and opiates. 4 bottles of wine a week, last drink Saturday. Last use of opiates Thursday.

## 2013-10-04 DIAGNOSIS — F329 Major depressive disorder, single episode, unspecified: Secondary | ICD-10-CM

## 2013-10-04 DIAGNOSIS — F3289 Other specified depressive episodes: Secondary | ICD-10-CM

## 2013-10-04 MED ORDER — PAROXETINE HCL 20 MG PO TABS: 20.0000 mg | ORAL_TABLET | Freq: Every day | ORAL | Status: DC

## 2013-10-04 NOTE — Consult Note (Signed)
Case discussed, agree with plan 

## 2013-10-04 NOTE — Progress Notes (Signed)
CSW was informed by Dr. Ladona Ridgelaylor the patient is psychiatrically cleared and ready for discharge.  [facility] and spoke with Nettie ElmSylvia who confirms an appointment 8/6@1 :15pm to see Sandra Rocha(therapist) and 8/22@9am  to see Sandra Rocha(psychiatrist).  CSW spoke with the patient and reviewed the discharge follow up appointment.     Sandra Rocha, MSW, [place] Valley[place]CSWA, 10/04/2013 Evening Clinical Social Worker [phone]

## 2013-10-04 NOTE — ED Provider Notes (Signed)
Medical screening examination/treatment/procedure(s) were performed by non-physician practitioner and as supervising physician I was immediately available for consultation/collaboration.   EKG Interpretation None       Derwood KaplanAnkit Liliahna Cudd, MD 10/04/13 1606

## 2013-10-04 NOTE — Consult Note (Signed)
  Psychiatric Specialty Exam: Physical Exam  ROS  Blood pressure 116/60, pulse 74, temperature 98.9 F (37.2 C), temperature source Oral, resp. rate 20, last menstrual period 10/03/2013, SpO2 100.00%.There is no weight on file to calculate BMI.  General Appearance: Well Groomed  Patent attorneyye Contact::  Good  Speech:  Clear and Coherent  Volume:  Normal  Mood:  Euthymic  Affect:  Appropriate  Thought Process:  Coherent and Logical  Orientation:  Full (Time, Place, and Person)  Thought Content:  Negative  Suicidal Thoughts:  No  Homicidal Thoughts:  No  Memory:  Immediate;   Good Recent;   Good Remote;   Good  Judgement:  Good  Insight:  Good  Psychomotor Activity:  Normal  Concentration:  Good  Recall:  Good  Akathisia:  Negative  Handed:  Right  AIMS (if indicated):     Assets:  Communication Skills Desire for Improvement Financial Resources/Insurance Housing Intimacy Leisure Time Physical Health Resilience Social Support Talents/Skills Transportation Vocational/Educational  Sleep:     [facility] therapy appt on 6 August and psychiatriy appt on 22 September.  Will restart Paxil which helped in the past.

## 2013-10-04 NOTE — Consult Note (Signed)
Review of Systems   Constitutional: Negative.    HENT: Negative.    Eyes: Negative.    Respiratory: Negative.    Cardiovascular: Negative.    Gastrointestinal: Negative.    Genitourinary: Negative.    Musculoskeletal: Negative.    Skin: Negative.    Neurological: Negative.    Endo/Heme/Allergies: Negative.    Psychiatric/Behavioral: Positive for depression.

## 2013-10-04 NOTE — [facility] Suicide Risk Assessment (Signed)
Suicide Risk Assessment  Discharge Assessment     Demographic Factors:  NA  Total Time spent with patient: 45 minutes  Psychiatric Specialty Exam:     Blood pressure 116/60, pulse 74, temperature 98.9 F (37.2 C), temperature source Oral, resp. rate 20, last menstrual period 10/03/2013, SpO2 100.00%.There is no weight on file to calculate BMI.  General Appearance: Well Groomed  Patent attorneyye Contact::  Good  Speech:  Clear and Coherent  Volume:  Normal  Mood:  Euthymic  Affect:  Appropriate  Thought Process:  Coherent and Logical  Orientation:  Full (Time, Place, and Person)  Thought Content:  Negative  Suicidal Thoughts:  No  Homicidal Thoughts:  No  Memory:  Immediate;   Good Recent;   Good Remote;   Good  Judgement:  Good  Insight:  Good  Psychomotor Activity:  Normal  Concentration:  Good  Recall:  Good  Fund of Knowledge:Good  Language: Good  Akathisia:  Negative  Handed:  Right  AIMS (if indicated):     Assets:  Communication Skills Desire for Improvement Financial Resources/Insurance Housing Intimacy Leisure Time Physical Health Resilience Social Support Talents/Skills Transportation Vocational/Educational  Sleep:       Musculoskeletal: Strength & Muscle Tone: within normal limits Gait & Station: normal Patient leans: N/A   Mental Status Per Nursing Assessment::   On Admission:     Current Mental Status by Physician: NA  Loss Factors: NA  Historical Factors: NA  Risk Reduction Factors:   NA  Continued Clinical Symptoms:  depression  Cognitive Features That Contribute To Risk:  none    Suicide Risk:  Minimal: No identifiable suicidal ideation.  Patients presenting with no risk factors but with morbid ruminations; may be classified as minimal risk based on the severity of the depressive symptoms  Discharge Diagnoses:   AXIS I:  Depressive Disorder NOS AXIS II:  Deferred AXIS III:  History reviewed. No pertinent past medical  history. AXIS IV:  occupational problems and other psychosocial or environmental problems AXIS V:  61-70 mild symptoms  Plan Of Care/Follow-up recommendations:  Activity:  resume usual activity Diet:  resume usual diet  Is patient on multiple antipsychotic therapies at discharge:  No   Has Patient had three or more failed trials of antipsychotic monotherapy by history:  No  Recommended Plan for Multiple Antipsychotic Therapies: NA    TAYLOR,GERALD D 10/04/2013, 1:28 PM

## 2013-10-05 NOTE — Progress Notes (Deleted)
Patient:   CADYN RODGER   DOB:   1975-03-26  MR Number:  [id number]  Location:  [facility] BEHAVIORAL HEALTH OUTPATIENT THERAPY [place] [id number] [place] [address] Dept: [phone]           Date of Service:   10/05/2013  Start Time:   *** End Time:   ***  Provider/Observer:  Mindi Curling Corlene Sabia Clinical Social Work       Billing Code/Service: [phone]  Chief Complaint:    No chief complaint on file.   Reason for Service:  ***  Current Status:  ***  Reliability of Information: ***  Behavioral Observation: SEQUITA WISE  presents as a 38 y.o.-year-old {Handed:22697} {Race/ethnicity:17218} {INFANT GENDER IN OR:22171} who appeared her stated age. her dress was {Desc;appropriate/inappropriate:5787::"Appropriate"} and she was {Appearance:22683} and her manners were {Desc;appropriate/inappropriate:5787::"Appropriate"} to the situation.  There {were/were WUX:32440} any physical disabilities noted.  she displayed an {Desc; ppropriate/inappropriate:30686::"appropriate"} level of cooperation and motivation.    Interactions:    {[facility] PARTICIPATION NUUVO:53664}   Attention:   {Desc; normal/abnormal/low/high:18745}  Memory:   {Desc; normal/abnormal/low/high:18745}  Visuo-spatial:   {Desc; normal/abnormal/low/high:18745}  Speech (Volume):  {desc; low/normal/high/v QIHK:74259}  Speech:   {findings; speech psych:31885}  Thought Process:  {[facility] THOUGHT PROCESS:22309}  Though Content:  {B[facility] THOUGHT CONTENT:22310}  Orientation:   {orientation:30299}  Judgment:   {B[facility] JUDGMENT:22312}  Planning:   {[facility] JUDGMENT:22312}  Affect:    {[facility] AFFECT:22266}  Mood:    {[facility] MOOD:22306}  Insight:   {Insight (PAA):22695}  Intelligence:   {desc; low/normal/high/v DGLO:75643}  Marital Status/Living: ***  Current Employment: ***  Past Employment:  ***  Substance Use:  {Substance abuse:20568}   ***  Education:   {Education:22679}  Medical History:  No past medical history on file.      Outpatient Encounter Prescriptions as of 10/06/2013  Medication Sig  . ALPRAZolam (XANAX) 0.5 MG tablet Take 0.5 mg by mouth daily.  Marland Kitchen atenolol (TENORMIN) 25 MG tablet Take 25 mg by mouth daily.  Marland Kitchen buPROPion (WELLBUTRIN XL) 300 MG 24 hr tablet Take 300 mg by mouth daily.  Marland Kitchen gabapentin (NEURONTIN) 600 MG tablet Take 1 pill in the morning, 1 pill midday, and 2 pills at night.  Marland Kitchen HYDROmorphone (DILAUDID) 2 MG tablet Take 2 mg by mouth every 4 (four) hours as needed for severe pain.  . hydrOXYzine (ATARAX/VISTARIL) 25 MG tablet Take 25 mg by mouth as needed.  Marland Kitchen ketorolac (TORADOL) 10 MG tablet Take 10 mg by mouth daily as needed.  . LO LOESTRIN FE 1 MG-10 MCG / 10 MCG tablet Take 1 tablet by mouth daily.  Marland Kitchen LORZONE 750 MG TABS Take 750 mg by mouth at bedtime as needed.  . naproxen sodium (ANAPROX) 220 MG tablet Take 440 mg by mouth every 6 (six) hours as needed.  Marland Kitchen PARoxetine (PAXIL) 20 MG tablet Take 1 tablet (20 mg total) by mouth daily.  . promethazine (PHENERGAN) 25 MG tablet Take 25 mg by mouth as needed. For severe migraine  . QUEtiapine (SEROQUEL) 25 MG tablet Take 25 mg by mouth as needed. For severe migraine  . SUMAtriptan (IMITREX) 100 MG tablet Take 100 mg by mouth as needed. For migraine  . traMADol (ULTRAM) 50 MG tablet Take 1 tablet (50 mg total) by mouth every 6 (six) hours as needed.  . traMADol-acetaminophen (ULTRACET) 37.5-325 MG per tablet Take 1 tablet by mouth every 6 (six) hours as needed for pain.        ***  Sexual History:   History  Sexual Activity  . Sexual Activity: Not on file    Abuse/Trauma History: ***  Psychiatric History:  ***  Family Med/Psych History: No family history on file.  Risk of Suicide/Violence: {desc; high/low:14016}  ***  Impression/DX:  ***  Disposition/Plan:  ***  Strengths:   ***  Weaknesses:   ***  Legal:    ***  Military:   *** Religion:   ***  Childhood:   ***  Abuse/trauma:  ***  Diagnosis:    No diagnosis found.         Briell Paulette M, LCSW

## 2013-10-06 ENCOUNTER — Encounter (HOSPITAL_COMMUNITY): Payer: Self-pay | Admitting: Licensed Clinical Social Worker

## 2013-10-06 ENCOUNTER — Ambulatory Visit (INDEPENDENT_AMBULATORY_CARE_PROVIDER_SITE_OTHER): Payer: BC Managed Care – PPO | Admitting: Licensed Clinical Social Worker

## 2013-10-06 DIAGNOSIS — F3289 Other specified depressive episodes: Secondary | ICD-10-CM | POA: Diagnosis not present

## 2013-10-06 DIAGNOSIS — F329 Major depressive disorder, single episode, unspecified: Secondary | ICD-10-CM | POA: Diagnosis not present

## 2013-10-06 NOTE — Progress Notes (Signed)
Patient:   Sandra Rocha   DOB:   02-May-1975  MR Number:  [id number]  Location:  [facility] BEHAVIORAL HEALTH OUTPATIENT THERAPY [place] [address] [id number] [place] [address] Dept: [phone]           Date of Service:   10/14/2013  Start Time:   2:12pm End Time:   3:12pm  Provider/Observer:  Genice Rouge Clinical Social Work       Billing Code/Service: [id number]  Chief Complaint:     Chief Complaint  Patient presents with  . Establish Care  . Depression  . Anxiety    Reason for Service:  Patient reports that she has experienced depression and anxiety over the past six months. Patient reports she also realized that she drinks up to four bottles of wine per week; 1-2 weeks per month.  Patient reports that she was prescribed several medications for migraines which made her feel worse. Patient reports that she attempted to stop taking all her medications at once approximately two weeks ago and she attempted to self-medicate which resulted in a visit to [facility] ED on 10/03/2013. She was not held in the ED and referred to Outpatient for therapy.   Current Status: Patient reports that she has felt much better since Monday after leaving the ED. Patient reports that she is now taking Paxil and her family is very supportive. Patient reports that over the past year she has had a decreased desire for her hobbies and lack of energy. Patient reports that she has noticed an increase in depression, anxiety, and the use of alcohol over the past six months.    Reliability of Information: Self report.   Behavioral Observation: Sandra Rocha  presents as a 38 y.o.-year-old Right Caucasian Female who appeared her stated age. her dress was Appropriate and she was Casual, Neat and Well Groomed and her manners were Appropriate to the situation.  There were any physical disabilities noted.  she displayed an appropriate level of cooperation and motivation.     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 There is a documented history of alcohol and prescription drug abuse confirmed by the patient.  Patient reported that over the past six months she has increased her wine consumption from an occasional glass to four bottles per week. Patient reports that she can go a week or two without drinking and then she will drink approximately four bottles of wine in one week and return to a week or two of not drinking.    Education:   College  Medical History:   Past Medical History  Diagnosis Date  . Fatigue   . Headache(784.0)   . Neuropathy  Outpatient Encounter Prescriptions as of 10/06/2013  Medication Sig  . gabapentin (NEURONTIN) 600 MG tablet Take 1 pill in the morning, 1 pill midday, and 2 pills at night.  Marland Kitchen PARoxetine (PAXIL) 20 MG tablet Take 1 tablet (20 mg total) by mouth daily.  . traMADol (ULTRAM) 50 MG tablet Take 1 tablet (50 mg total) by mouth every 6 (six) hours as needed.  . ALPRAZolam (XANAX) 0.5 MG tablet Take 0.5 mg by mouth daily.  Marland Kitchen atenolol (TENORMIN) 25 MG tablet Take 25 mg by mouth daily.  Marland Kitchen buPROPion (WELLBUTRIN XL) 300 MG 24 hr tablet Take 300 mg by mouth daily.  Marland Kitchen HORIZANT 600 MG TBCR   . HYDROmorphone (DILAUDID) 2 MG tablet Take 2 mg by  mouth every 4 (four) hours as needed for severe pain.  . hydrOXYzine (ATARAX/VISTARIL) 25 MG tablet Take 25 mg by mouth as needed.  Marland Kitchen ketorolac (TORADOL) 10 MG tablet Take 10 mg by mouth daily as needed.  . LO LOESTRIN FE 1 MG-10 MCG / 10 MCG tablet Take 1 tablet by mouth daily.  Marland Kitchen LORZONE 750 MG TABS Take 750 mg by mouth at bedtime as needed.  . naproxen sodium (ANAPROX) 220 MG tablet Take 440 mg by mouth every 6 (six) hours as needed.  . promethazine (PHENERGAN) 25 MG tablet Take 25 mg by mouth as needed. For severe migraine  . QUEtiapine (SEROQUEL) 25 MG tablet Take 25 mg by mouth as needed. For severe migraine  . rizatriptan (MAXALT-MLT) 10 MG disintegrating tablet   . SUMAtriptan (IMITREX) 100 MG tablet Take 100 mg by mouth as needed. For migraine  . traMADol-acetaminophen (ULTRACET) 37.5-325 MG per tablet Take 1 tablet by mouth every 6 (six) hours as needed for pain.          Sexual History:   History  Sexual Activity  . Sexual Activity: Yes  . Birth Control/ Protection: Pill    Abuse/Trauma History: None reported/patient denies  Psychiatric History:  [facility] visit this past Monday, October 03, 2013.    Family Med/Psych History: History reviewed. No pertinent family history.  Risk of Suicide/Violence: virtually non-existent, patient denies past and current suicide ideation, intent, and/or plan. Patient reports that she has never thought of harming herself.   Impression/DX:  Sandra Rocha is a 38 year old female looking to re-establish therapy with new provider for increased depression and anxiety over the past six months  She reports an increase in anxiety due to having a new manager at work who is very demanding, caring for a child who has a birth defect and needs ongoing medical care, and stress from her parents. 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 Patient reports that her depression has increased over the  past year due to blaming herself for her sons birth defects, the negative relationship with her manager, and increase in physical pain without relief. .   She reports she has a strong supportive network but lacks confidence in her relationships  and energy for doing enjoyable activities for herself.  She reports she has anxiety about someone learning of her mental illness and she has guilt for putting her husband "through this again" due his mother having a history of mental illness.  Impression at this time is  depressive disorder.`.    Disposition/Plan:  Plan is to meet every week to establish a therapeutic relationship and move to bi-weekly sessions working on therapy and address  stressors related to work/life balance, understanding depression caring for her child, and setting boundaries with her parents.  Patient agreeable to treatment plan also using CBT, solution focused therapy, and strength's based practices.   Strengths:   Patient reports her strengths as being confident, good natural support system, highly ethical, "good girl", confident in job, and "pretty strong."  Weaknesses:   Patient reports her weaknesses as being unable manage stress well and blaming herself for her son's birth defects.   Legal:    None Reported  Military:   None reported Religion:              Christian Malena Edman/[employer]  Childhood:    Patient denies any significant childhood events. Patient reports being shameful of her mother having her brother at the age of 53sixteen.  Patient reports that she has faint memories of her grandfathers behavior during the times he appeared to be drinking.      Diagnosis:    Depressive disorder, not elsewhere classified          Tavarion Babington M, LCSW

## 2013-10-07 ENCOUNTER — Encounter (HOSPITAL_COMMUNITY): Payer: Self-pay | Admitting: Licensed Clinical Social Worker

## 2013-10-13 ENCOUNTER — Ambulatory Visit (HOSPITAL_COMMUNITY): Payer: Self-pay | Admitting: Licensed Clinical Social Worker

## 2013-10-20 ENCOUNTER — Ambulatory Visit (INDEPENDENT_AMBULATORY_CARE_PROVIDER_SITE_OTHER): Payer: BC Managed Care – PPO | Admitting: Licensed Clinical Social Worker

## 2013-10-20 ENCOUNTER — Telehealth (HOSPITAL_COMMUNITY): Payer: Self-pay | Admitting: Licensed Clinical Social Worker

## 2013-10-20 DIAGNOSIS — F411 Generalized anxiety disorder: Secondary | ICD-10-CM | POA: Diagnosis not present

## 2013-10-20 NOTE — Progress Notes (Signed)
   THERAPIST PROGRESS NOTE  Session Time: 4:06PM - 5:03PM  Participation Level: Active  Behavioral Response: Neat and Well GroomedAlertAnxious  Type of Therapy: Individual Therapy  Treatment Goals addressed: Anxiety  Interventions: Motivational Interviewing, Strength-based and Supportive  Summary: Sandra ChesterCynthia D Rocha is a 38 y.o. female who presents with symptoms of anxiety. Patient presents seeming less anxious than previously. patient made good eye contact for the majority of the session. Patient was well groomed and neat. Patient reports that she is struggling with sleeping at this time. Patient reports that she has been taking her medications as prescribed and she has not had any alcohol since our last session. Patient discussed potential goals for her treatment plan. Patient reports that she would like to gain positive coping skills, developing a "work-life balance" eliminating feelings of guilt for her son's birth defects and rating the pros and cons of her job. Patient began the list of the pros and cons of leaving her job while in the session. Patient reports that she would like to learn to be happy.   Suicidal/Homicidal: Nowithout intent/plan  Therapist Response: Therapist discussed the plan for ongoing therapy as developing a treatment a crisis plan. Therapist discussed possible goals and engaged the patient to think about her goals. Therapist encouraged the patient to think about where she is now as opposed to the place she would like to be in her life and asked the patient to set smaller goals to reach that larger goal. Therapist asked the patient to review the pros and cons of leaving her job in ordering to establish a "work-life" balance. Therapist listened actively and provided positive feedback as needed.   Plan: Return again in 1 weeks.  Diagnosis: Axis I: Generalized Anxiety Disorder    Axis II: No diagnosis        [name], Benen Weida M, LCSW 10/20/2013

## 2013-10-21 ENCOUNTER — Telehealth (HOSPITAL_COMMUNITY): Payer: Self-pay

## 2013-11-01 ENCOUNTER — Ambulatory Visit (HOSPITAL_COMMUNITY): Payer: Self-pay | Admitting: Licensed Clinical Social Worker

## 2013-11-03 ENCOUNTER — Ambulatory Visit (INDEPENDENT_AMBULATORY_CARE_PROVIDER_SITE_OTHER): Payer: BC Managed Care – PPO | Admitting: Psychiatry

## 2013-11-03 ENCOUNTER — Encounter (HOSPITAL_COMMUNITY): Payer: Self-pay | Admitting: Psychiatry

## 2013-11-03 VITALS — BP 137/85 | HR 104 | Ht 67.0 in | Wt 194.8 lb

## 2013-11-03 DIAGNOSIS — F3289 Other specified depressive episodes: Secondary | ICD-10-CM

## 2013-11-03 DIAGNOSIS — F321 Major depressive disorder, single episode, moderate: Secondary | ICD-10-CM

## 2013-11-03 DIAGNOSIS — F1010 Alcohol abuse, uncomplicated: Secondary | ICD-10-CM

## 2013-11-03 DIAGNOSIS — F329 Major depressive disorder, single episode, unspecified: Secondary | ICD-10-CM

## 2013-11-03 MED ORDER — DULOXETINE HCL 30 MG PO CPEP: ORAL_CAPSULE | ORAL | Status: DC

## 2013-11-03 NOTE — Progress Notes (Signed)
[facility]al Health Initial Assessment Note  Sandra Rocha [id number] 38 y.o.  11/03/2013 10:12 AM  Chief Complaint:  I  was seen in the emergency room last month because of severe depression, anxiety symptoms and I was drinking too much.  History of Present Illness:  Patient is a 38 year old Caucasian, married, employed female who is referred from emergency room for the management of depression and anxiety symptoms.  Patient visited the emergency room when she was accompanied by her husband for evaluation.  At that time patient was drinking up to 3-4 glasses of wine, suffering from severe depression, anxiety and using pain medication.  Her blood alcohol level was normal in her UDS was positive for opiates.  She also mentioned that she had stopped her antidepressant for 2 weeks because she felt she was not doing well.  Patient endorsed multiple stressors in her life.  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  She also mentioned chronic back pain which also degenerative disc disease and recently she has increased pain.  She also had chronic migraine headaches and her neurologist Dr. Hulan Fray in [facility] tried her multiple pain medication with limited relief.  She was very frustrated and she stopped all the medication because it was not doing good.  She has 2 children and her 35-year-old son has congenital abnormalities and he requires multiple visits to the doctors.  The patient told her parents live close by and she is under a lot of pressure from them to move next door.  Patient told her husband is supportive but sometimes she feels in the middle of everything.  She admitted to increased drinking this year to the point that she felt drinking controlling her life.  She endorsed feelings of depression, irritability, decreased energy, crying spells, poor sleep, anxiety and nervousness.  She was taken Wellbutrin,  Seroquel, Vistaril, Dilaudid, Ultram and Imitrex from her neurologist for chronic migraine headaches.  As part chart she has stolen Dilaudid from her cousin but patient denied during today's conversation.  In the emergency room she was given Paxil which she has taken in the past with good response.  Patient continues to have chronic back pain and anxiety symptoms.  She endorsed some time hopeless and worthless feeling.  She also endorsed increase weight gain in recent months.  She endorsed difficulty in attention concentration, fatigue, lack of energy and continues to have anxiety symptoms.  She is seeing a therapist in the office.  She denies any paranoia, hallucinations, suicidal thoughts or homicidal thought.  Patient denies any nightmares, flashback , aggression or violence.  Since she is released from the emergency room she has not drinking.  She has not taken any control pain medication.  She is open to try a different medication.  She is prescribed Xanax by her OB/GYN however she has not taken Xanax in recent months.   Suicidal Ideation: No Plan Formed: No Patient has means to carry out plan: No  Homicidal Ideation: No Plan Formed: No Patient has means to carry out plan: No   Past Psychiatric History/Hospitalization(s) Patient diagnosed with postpartum depression 9 years ago and at that time she saw a therapist when she was given Paxil with good response.  She stopped the medication before her second pregnancy and after her son born she resumed with Lexapro.  She was taking Lexapro until July of 2015 she stopped .  During recent months her  neurologist has prescribed Wellbutrin, Seroquel, Vistaril for the treatment of migraine headaches.  Patient denies any history of suicidal attempt, inpatient treatment, psychosis, mania, hallucination , anger or violence.  She is taking Xanax which is prescribed by her OB/GYN. Anxiety: Yes Bipolar Disorder: No Depression: Yes Mania: No Psychosis:  No Schizophrenia: No Personality Disorder: No Hospitalization for psychiatric illness: Patient stayed overnight in the emergency room in August 2015 because of severe depression History of Electroconvulsive Shock Therapy: No Prior Suicide Attempts: No  Medical History; Patient has multiple health issues.  She has chronic back pain due to 2 bulging disc, migraine headaches and neuropathy.  Patient denies any seizures.  Traumatic brain injury: Patient denies any history of traumatic brain injury.  Family History; Patient endorse father has depression and he takes Lexapro.  Patient's paternal grandparents had alcohol problems.  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  Psychosocial History; Patient is born and raised in [place].  She has been married for 13 years.  Her husband is very supportive.  She has 2 children.  Her son is 41 years old who has congenital abnormalities and he requires multiple doctor's visit.  Patient has 80-year-old daughter.  Patient stated and lives close by.  Legal History; Patient denies any history of legal issues.  History Of Abuse; Patient denies any history of abuse.  Substance Abuse History; Patient endorses history of drinking socially until one year ago she has been drinking more than usual.  She is drinking 3-4 glasses of wine every day.  Patient also endorsed intimidation up using pain medication.  Patient denies any tremors, shakes, withdrawal symptoms.  She denies any seizures or blackouts.   Review of Systems: Psychiatric: Agitation: No Hallucination: No Depressed Mood: Yes Insomnia: Yes Hypersomnia: No Altered Concentration: No Feels Worthless: No Grandiose Ideas: No Belief In Special Powers: No New/Increased Substance Abuse: No Compulsions: No  Neurologic: Headache: Yes Seizure: No Paresthesias: Yes   Musculoskeletal: Strength & Muscle  Tone: within normal limits Gait & Station: normal Patient leans: N/A   Outpatient Encounter Prescriptions as of 11/03/2013  Medication Sig  . DULoxetine (CYMBALTA) 30 MG capsule Take 1 capsule daily for 1 week and than 2 capsule daily  . gabapentin (NEURONTIN) 600 MG tablet Take 1 pill in the morning, 1 pill midday, and 2 pills at night.  . LO LOESTRIN FE 1 MG-10 MCG / 10 MCG tablet Take 1 tablet by mouth daily.  . rizatriptan (MAXALT-MLT) 10 MG disintegrating tablet   . traMADol (ULTRAM) 50 MG tablet Take 1 tablet (50 mg total) by mouth every 6 (six) hours as needed.  . [DISCONTINUED] ALPRAZolam (XANAX) 0.5 MG tablet Take 0.5 mg by mouth daily.  . [DISCONTINUED] atenolol (TENORMIN) 25 MG tablet Take 25 mg by mouth daily.  . [DISCONTINUED] buPROPion (WELLBUTRIN XL) 300 MG 24 hr tablet Take 300 mg by mouth daily.  . [DISCONTINUED] HORIZANT 600 MG TBCR   . [DISCONTINUED] HYDROmorphone (DILAUDID) 2 MG tablet Take 2 mg by mouth every 4 (four) hours as needed for severe pain.  . [DISCONTINUED] hydrOXYzine (ATARAX/VISTARIL) 25 MG tablet Take 25 mg by mouth as needed.  . [DISCONTINUED] ketorolac (TORADOL) 10 MG tablet Take 10 mg by mouth daily as needed.  . [DISCONTINUED] LORZONE 750 MG TABS Take 750 mg by mouth at bedtime as needed.  . [DISCONTINUED] naproxen sodium (ANAPROX) 220 MG tablet Take 440 mg by mouth every 6 (  six) hours as needed.  . [DISCONTINUED] PARoxetine (PAXIL) 20 MG tablet Take 1 tablet (20 mg total) by mouth daily.  . [DISCONTINUED] promethazine (PHENERGAN) 25 MG tablet Take 25 mg by mouth as needed. For severe migraine  . [DISCONTINUED] QUEtiapine (SEROQUEL) 25 MG tablet Take 25 mg by mouth as needed. For severe migraine  . [DISCONTINUED] SUMAtriptan (IMITREX) 100 MG tablet Take 100 mg by mouth as needed. For migraine  . [DISCONTINUED] traMADol-acetaminophen (ULTRACET) 37.5-325 MG per tablet Take 1 tablet by mouth every 6 (six) hours as needed for pain.    Recent Results (from  the past 2160 hour(s))  URINE RAPID DRUG SCREEN (HOSP PERFORMED)     Status: Abnormal   Collection Time    10/03/13  2:13 PM      Result Value Ref Range   Opiates POSITIVE (*) NONE DETECTED   Cocaine NONE DETECTED  NONE DETECTED   Benzodiazepines NONE DETECTED  NONE DETECTED   Amphetamines NONE DETECTED  NONE DETECTED   Tetrahydrocannabinol NONE DETECTED  NONE DETECTED   Barbiturates NONE DETECTED  NONE DETECTED   Comment:            DRUG SCREEN FOR MEDICAL PURPOSES     ONLY.  IF CONFIRMATION IS NEEDED     FOR ANY PURPOSE, NOTIFY LAB     WITHIN 5 DAYS.                LOWEST DETECTABLE LIMITS     FOR URINE DRUG SCREEN     Drug Class       Cutoff (ng/mL)     Amphetamine      1000     Barbiturate      200     Benzodiazepine   567     Tricyclics       014     Opiates          300     Cocaine          300     THC              50  ACETAMINOPHEN LEVEL     Status: None   Collection Time    10/03/13  2:26 PM      Result Value Ref Range   Acetaminophen (Tylenol), Serum <15.0  10 - 30 ug/mL   Comment:            THERAPEUTIC CONCENTRATIONS VARY     SIGNIFICANTLY. A RANGE OF 10-30     ug/mL MAY BE AN EFFECTIVE     CONCENTRATION FOR MANY PATIENTS.     HOWEVER, SOME ARE BEST TREATED     AT CONCENTRATIONS OUTSIDE THIS     RANGE.     ACETAMINOPHEN CONCENTRATIONS     >150 ug/mL AT 4 HOURS AFTER     INGESTION AND >50 ug/mL AT 12     HOURS AFTER INGESTION ARE     OFTEN ASSOCIATED WITH TOXIC     REACTIONS.  CBC     Status: Abnormal   Collection Time    10/03/13  2:26 PM      Result Value Ref Range   WBC 12.1 (*) 4.0 - 10.5 K/uL   RBC 4.62  3.87 - 5.11 MIL/uL   Hemoglobin 14.1  12.0 - 15.0 g/dL   HCT 41.6  36.0 - 46.0 %   MCV 90.0  78.0 - 100.0 fL   MCH 30.5  26.0 - 34.0 pg   MCHC  33.9  30.0 - 36.0 g/dL   RDW 12.5  11.5 - 15.5 %   Platelets 390  150 - 400 K/uL  COMPREHENSIVE METABOLIC PANEL     Status: Abnormal   Collection Time    10/03/13  2:26 PM      Result Value Ref Range    Sodium 140  137 - 147 mEq/L   Potassium 4.3  3.7 - 5.3 mEq/L   Chloride 104  96 - 112 mEq/L   CO2 22  19 - 32 mEq/L   Glucose, Bld 139 (*) 70 - 99 mg/dL   BUN 20  6 - 23 mg/dL   Creatinine, Ser 0.74  0.50 - 1.10 mg/dL   Calcium 9.0  8.4 - 10.5 mg/dL   Total Protein 8.0  6.0 - 8.3 g/dL   Albumin 4.2  3.5 - 5.2 g/dL   AST 24  0 - 37 U/L   ALT 34  0 - 35 U/L   Alkaline Phosphatase 78  39 - 117 U/L   Total Bilirubin 0.2 (*) 0.3 - 1.2 mg/dL   GFR calc non Af Amer >90  >90 mL/min   GFR calc Af Amer >90  >90 mL/min   Comment: (NOTE)     The eGFR has been calculated using the CKD EPI equation.     This calculation has not been validated in all clinical situations.     eGFR's persistently <90 mL/min signify possible Chronic Kidney     Disease.   Anion gap 14  5 - 15  ETHANOL     Status: None   Collection Time    10/03/13  2:26 PM      Result Value Ref Range   Alcohol, Ethyl (B) <11  0 - 11 mg/dL   Comment:            LOWEST DETECTABLE LIMIT FOR     SERUM ALCOHOL IS 11 mg/dL     FOR MEDICAL PURPOSES ONLY  SALICYLATE LEVEL     Status: Abnormal   Collection Time    10/03/13  2:26 PM      Result Value Ref Range   Salicylate Lvl <8.0 (*) 2.8 - 20.0 mg/dL      Constitutional:  BP 137/85  Pulse 104  Ht '5\' 7"'  (1.702 m)  Wt 194 lb 12.8 oz (88.361 kg)  BMI 30.50 kg/m2   Mental Status Examination;  Patient is well groomed well dressed female who appears to be in her stated age.  She is anxious but cooperative.  Her speech is slow, clear, fluent and coherent.  Her thought process logical and goal-directed.  She described her mood is anxious and depressed and her affect is constricted.  There were no delusions, paranoia or any obsessive thoughts.  Her psychomotor activity is normal.  Her fund of knowledge is good.  Patient denies any auditory or visual hallucination.  She denies any active or passive suicidal thoughts or homicidal thoughts.  She is alert and oriented x3.  There were no  flight of ideas or any loose association.  Her insight judgment and impulse control is okay.   New problem, with additional work up planned, Review of Psycho-Social Stressors (1), Review or order clinical lab tests (1), Decision to obtain old records (1), Review and summation of old records (2), Established Problem, Worsening (2), Review of Medication Regimen & Side Effects (2) and Review of New Medication or Change in Dosage (2)  Assessment: Axis I: Depressive disorder, recurrent.  Alcohol abuse disorder  Axis II: Deferred  Axis III:  Past Medical History  Diagnosis Date  . Headache(784.0)   . Neuropathy   . Anxiety     Axis IV: Moderate   Plan:  I reviewed the symptoms, history, blood work results, current medication and her psychosocial stressors.  Patient is taking Paxil 20 mg daily.  She still have a lot of symptoms of depression and anxiety.  She has stopped drinking since she released from the emergency room.  She is seeing therapists in this office.  I recommended a trial of Cymbalta which has advantage to help chronic pain.  Patient agreed.  I will discontinue Paxil and start Cymbalta 30 mg daily for one week and then 60 mg daily.  Discuss in detail the risks and benefits of medication.  Talk about withdrawal symptoms of Cymbalta in detail.  Recommended to keep appointment with a therapist for coping and social skills.  Discussed alcohol use and abusing pain medication may cause interference for recovery.  Recommended to call us back if she has any question or any concern.  I will see her again in 3 weeks. Time spent 5 alcohol level 5 minutes.  More than 50% of the time spent in psychoeducation, counseling and coordination of care.  Discuss safety plan that anytime having active suicidal thoughts or homicidal thoughts then patient need to call 911 or go to the local emergency room.    Quantasia Stegner T., MD 11/03/2013

## 2013-11-11 ENCOUNTER — Ambulatory Visit (INDEPENDENT_AMBULATORY_CARE_PROVIDER_SITE_OTHER): Payer: BC Managed Care – PPO | Admitting: Licensed Clinical Social Worker

## 2013-11-11 DIAGNOSIS — F411 Generalized anxiety disorder: Secondary | ICD-10-CM

## 2013-11-11 NOTE — Progress Notes (Signed)
   THERAPIST PROGRESS NOTE  Session Time: 10:07AM-10:57AM   Participation Level: Active  Behavioral Response: Neat and Well GroomedAlertAnxious  Type of Therapy: Individual Therapy  Treatment Goals addressed: Anxiety  Interventions: CBT, Motivational Interviewing and Supportive  Summary: Sandra Rocha is a 38 y.o. female who presents with symptoms of depression and anxiety. Patient was well-groomed, neat and made good eye contact. Patient reports that she has not used any drugs other than those that were prescribed and she had two beers ut "felt bad." Patient processed her feelings around drinking beer and reports that her husband got rid of all of the alcohol in their home. Patient discussed her journaling and how she feels that it has helped. Patient reports that she has not journaled this week due to her work schedule and she feels a difference. Patient reviewed and signed the treatment plan. Patient reports that she has attempted new coping skills and reports how she would like to learn to knit and scrapbook. Patient discussed work life balance and how she would like to learn to achieve that balance. Patient denies SI/HI and reports some feelings of anxiety with lack of sleep.    Suicidal/Homicidal: Nowithout intent/plan  Therapist Response: LCSW assessed the patients current symptoms and followed up with teh patients homework of her journaling. LCSW inquired about teh patient's FMLA paperwork and will relay the information to the psychiatrist, Dr. Lolly Mustache. LCSW listened actively and provided positive feedback as needed. LCSW reviewed and signed the treatment plan with the patient.   Plan: Return again in 1 week. Continue to journal, write down triggers for anxiety and depression while journaling, look through this lists of coping skills to try and report the coping skills that have worked the best over the past week. Continue to keep appointments and take medications as prescribed.     Diagnosis: Axis I: Generalized Anxiety Disorder       Ronney Lion 11/11/2013

## 2013-11-22 ENCOUNTER — Ambulatory Visit (INDEPENDENT_AMBULATORY_CARE_PROVIDER_SITE_OTHER): Payer: BC Managed Care – PPO | Admitting: Licensed Clinical Social Worker

## 2013-11-22 ENCOUNTER — Ambulatory Visit (HOSPITAL_COMMUNITY): Payer: BC Managed Care – PPO | Admitting: Psychiatry

## 2013-11-22 DIAGNOSIS — F411 Generalized anxiety disorder: Secondary | ICD-10-CM

## 2013-11-23 NOTE — Progress Notes (Signed)
   THERAPIST PROGRESS NOTE  Session Time: 4:10PM-5:05PM  Participation Level: Active  Behavioral Response: Casual and NeatAlertAnxious  Type of Therapy: Individual Therapy  Treatment Goals addressed: Anxiety  Improve satisfaction and comfort surrounding work-life balance    Interventions: CBT, Motivational Interviewing, Strength-based and Supportive, Clarify nature of conflicts in work settings, Probe and clarify emotions surrounding work stress   Summary: JILLYN STACEY is a 38 y.o. female who presents with symptoms of anxiety. Patient discussed the progress on her homework from last session. Patient reports that journaling was difficult due to both of her children been sick over the past week. Patient discussed her symptoms of anxiety over the past week and reports that she feels better when she journals. Patient discussed her work stress and how she did not go to work today due to having a migraine.  Patient discussed her roles and responsibilities and her stress levels. Patient reports how she worries excessively about tasks although she is aware that she cannot complete them all. Patient reports that she will continue journaling and will continue to weigh the pros and cons of working. Patient denied SI/HI and psychosis at the time of the assessment.    Suicidal/Homicidal: Nowithout intent/plan  Therapist Response: LCSW assessed the patients symptoms, listened actively, provided feedback and amde reflective statements. LCSW asked probing questions and challenged the patients thought pattern related to work stress. LCSW discussed coping skills that have worked in the past and encouraged the patient to use her coping skills consistently.  LCSW encouraged the patient to weigh the list of pros and cons of her job, do research on available positions in her field that may have a reduced schedule/workload, look into knitting, continue to journal and add a gratitude component of writing three  things that she is grateful for and reflecting on those things at the end of the week.    Plan: Return again in 2 weeks, complete homework assignments, take medications as prescribed, keep scheduled appointments, and utilize mobile crisis in case of a mental health emergency.    Diagnosis:  Generalized Anxiety Disorder        Ronney Lion 11/23/2013

## 2013-11-30 ENCOUNTER — Encounter (HOSPITAL_COMMUNITY): Payer: Self-pay | Admitting: Psychiatry

## 2013-11-30 ENCOUNTER — Ambulatory Visit (INDEPENDENT_AMBULATORY_CARE_PROVIDER_SITE_OTHER): Payer: BC Managed Care – PPO | Admitting: Psychiatry

## 2013-11-30 VITALS — BP 128/79 | HR 100 | Ht 67.0 in | Wt 201.0 lb

## 2013-11-30 DIAGNOSIS — F321 Major depressive disorder, single episode, moderate: Secondary | ICD-10-CM

## 2013-11-30 DIAGNOSIS — F3289 Other specified depressive episodes: Secondary | ICD-10-CM

## 2013-11-30 DIAGNOSIS — F1010 Alcohol abuse, uncomplicated: Secondary | ICD-10-CM

## 2013-11-30 DIAGNOSIS — F329 Major depressive disorder, single episode, unspecified: Secondary | ICD-10-CM

## 2013-11-30 MED ORDER — TOPIRAMATE 25 MG PO TABS: 25.0000 mg | ORAL_TABLET | Freq: Two times a day (BID) | ORAL | Status: DC

## 2013-11-30 MED ORDER — DULOXETINE HCL 60 MG PO CPEP: 60.0000 mg | ORAL_CAPSULE | Freq: Every day | ORAL | Status: DC

## 2013-11-30 NOTE — Progress Notes (Signed)
Sandra Rocha [phone] Progress Note   Sandra Rocha [id number] 38 y.o.  11/30/2013 3:57 PM  Chief Complaint:  I am taking Cymbalta 60 mg .  I do not have any side effects but also I does not feel any improvement .  I guess I need to give more time to the medication.    History of Present Illness:  Sandra Rocha came for her followup appointment.  She was seen first time on September 3 at his initial evaluation.  She is 38 year old Caucasian, married, employed female who was referred from emergency room for the management of depression and anxiety symptoms.  She was drinking alcohol and using pain medication.  We discontinued Paxil and started her on Cymbalta.  She is taking Cymbalta 60 mg.  She denies any irritability, crying spells or severe depression but also for does not feel she needs improvement with Cymbalta.  She endorsed multiple migraine headaches episodes.  She endorsed husband is very supportive.  Her husband is keeping all the medication.  Patient has history of abusing pain medication.  She was taking multiple psychotropic medication which is prescribed by her neurologist for chronic migraine headaches.  Patient still feels some time poor attention, poor concentration fatigue and lack of energy however she denies any tremors or shakes.  She is seeing therapist Ms. office for coping and social skills.  She feels proud that she is not drinking or using any narcotic pain medication.  Her sleep is better.  Her appetite is okay.  Her vitals are stable.  Suicidal Ideation: No Plan Formed: No Patient has means to carry out plan: No  Homicidal Ideation: No Plan Formed: No Patient has means to carry out plan: No  Past Psychiatric History/Hospitalization(s) Patient diagnosed with postpartum depression 9 years ago and at that time she saw a therapist when she was given Paxil with good response.  She stopped the medication before her second pregnancy and after her son born she resumed with  Lexapro.  She was taking Lexapro until July of 2015 she stopped .  During recent months her neurologist has prescribed Wellbutrin, Seroquel, Vistaril for the treatment of migraine headaches.  Patient denies any history of suicidal attempt, inpatient treatment, psychosis, mania, hallucination , anger or violence.  She is taking Xanax which is prescribed by her OB/GYN. Anxiety: Yes Bipolar Disorder: No Depression: Yes Mania: No Psychosis: No Schizophrenia: No Personality Disorder: No Hospitalization for psychiatric illness: Patient stayed overnight in the emergency room in August 2015 because of severe depression History of Electroconvulsive Shock Therapy: No Prior Suicide Attempts: No  Medical History; Patient has multiple health issues.  She has chronic back pain due to 2 bulging disc, migraine headaches and neuropathy.  Patient denies any seizures.  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  Psychosocial History; Patient is born and raised in [place].  She has been married for 13 years.  Her husband is very supportive.  She has 2 children.  Her son is 68 years old who has congenital abnormalities and he requires multiple doctor's visit.  Patient has 71-year-old daughter.  Patient stated and lives close by.  Review of Systems: Psychiatric: Agitation: No Hallucination: No Depressed Mood: Yes Insomnia: Yes Hypersomnia: No Altered Concentration: No Feels Worthless: No Grandiose Ideas: No Belief In Special Powers: No New/Increased Substance Abuse: No Compulsions: No  Neurologic: Headache: Yes Seizure: No Paresthesias: Yes   Musculoskeletal: Strength &  Muscle Tone: within normal limits Gait & Station: normal Patient leans: N/A   Outpatient Encounter Prescriptions as of 11/30/2013  Medication Sig  . DULoxetine (CYMBALTA) 60 MG capsule Take 1 capsule (60 mg total) by mouth daily.  Marland Kitchen  gabapentin (NEURONTIN) 600 MG tablet Take 1 pill in the morning, 1 pill midday, and 2 pills at night.  . LO LOESTRIN FE 1 MG-10 MCG / 10 MCG tablet Take 1 tablet by mouth daily.  . rizatriptan (MAXALT-MLT) 10 MG disintegrating tablet   . topiramate (TOPAMAX) 25 MG tablet Take 1 tablet (25 mg total) by mouth 2 (two) times daily.  . traMADol (ULTRAM) 50 MG tablet Take 1 tablet (50 mg total) by mouth every 6 (six) hours as needed.  . [DISCONTINUED] DULoxetine (CYMBALTA) 30 MG capsule Take 1 capsule daily for 1 week and than 2 capsule daily    Recent Results (from the past 2160 hour(s))  URINE RAPID DRUG SCREEN (HOSP PERFORMED)     Status: Abnormal   Collection Time    10/03/13  2:13 PM      Result Value Ref Range   Opiates POSITIVE (*) NONE DETECTED   Cocaine NONE DETECTED  NONE DETECTED   Benzodiazepines NONE DETECTED  NONE DETECTED   Amphetamines NONE DETECTED  NONE DETECTED   Tetrahydrocannabinol NONE DETECTED  NONE DETECTED   Barbiturates NONE DETECTED  NONE DETECTED   Comment:            DRUG SCREEN FOR MEDICAL PURPOSES     ONLY.  IF CONFIRMATION IS NEEDED     FOR ANY PURPOSE, NOTIFY LAB     WITHIN 5 DAYS.                LOWEST DETECTABLE LIMITS     FOR URINE DRUG SCREEN     Drug Class       Cutoff (ng/mL)     Amphetamine      1000     Barbiturate      200     Benzodiazepine   646     Tricyclics       803     Opiates          300     Cocaine          300     THC              50  ACETAMINOPHEN LEVEL     Status: None   Collection Time    10/03/13  2:26 PM      Result Value Ref Range   Acetaminophen (Tylenol), Serum <15.0  10 - 30 ug/mL   Comment:            THERAPEUTIC CONCENTRATIONS VARY     SIGNIFICANTLY. A RANGE OF 10-30     ug/mL MAY BE AN EFFECTIVE     CONCENTRATION FOR MANY PATIENTS.     HOWEVER, SOME ARE BEST TREATED     AT CONCENTRATIONS OUTSIDE THIS     RANGE.     ACETAMINOPHEN CONCENTRATIONS     >150 ug/mL AT 4 HOURS AFTER     INGESTION AND >50 ug/mL AT 12      HOURS AFTER INGESTION ARE     OFTEN ASSOCIATED WITH TOXIC     REACTIONS.  CBC     Status: Abnormal   Collection Time    10/03/13  2:26 PM      Result Value Ref Range   WBC 12.1 (*)  4.0 - 10.5 K/uL   RBC 4.62  3.87 - 5.11 MIL/uL   Hemoglobin 14.1  12.0 - 15.0 g/dL   HCT 41.6  36.0 - 46.0 %   MCV 90.0  78.0 - 100.0 fL   MCH 30.5  26.0 - 34.0 pg   MCHC 33.9  30.0 - 36.0 g/dL   RDW 12.5  11.5 - 15.5 %   Platelets 390  150 - 400 K/uL  COMPREHENSIVE METABOLIC PANEL     Status: Abnormal   Collection Time    10/03/13  2:26 PM      Result Value Ref Range   Sodium 140  137 - 147 mEq/L   Potassium 4.3  3.7 - 5.3 mEq/L   Chloride 104  96 - 112 mEq/L   CO2 22  19 - 32 mEq/L   Glucose, Bld 139 (*) 70 - 99 mg/dL   BUN 20  6 - 23 mg/dL   Creatinine, Ser 0.74  0.50 - 1.10 mg/dL   Calcium 9.0  8.4 - 10.5 mg/dL   Total Protein 8.0  6.0 - 8.3 g/dL   Albumin 4.2  3.5 - 5.2 g/dL   AST 24  0 - 37 U/L   ALT 34  0 - 35 U/L   Alkaline Phosphatase 78  39 - 117 U/L   Total Bilirubin 0.2 (*) 0.3 - 1.2 mg/dL   GFR calc non Af Amer >90  >90 mL/min   GFR calc Af Amer >90  >90 mL/min   Comment: (NOTE)     The eGFR has been calculated using the CKD EPI equation.     This calculation has not been validated in all clinical situations.     eGFR's persistently <90 mL/min signify possible Chronic Kidney     Disease.   Anion gap 14  5 - 15  ETHANOL     Status: None   Collection Time    10/03/13  2:26 PM      Result Value Ref Range   Alcohol, Ethyl (B) <11  0 - 11 mg/dL   Comment:            LOWEST DETECTABLE LIMIT FOR     SERUM ALCOHOL IS 11 mg/dL     FOR MEDICAL PURPOSES ONLY  SALICYLATE LEVEL     Status: Abnormal   Collection Time    10/03/13  2:26 PM      Result Value Ref Range   Salicylate Lvl <5.6 (*) 2.8 - 20.0 mg/dL      Constitutional:  BP 128/79  Pulse 100  Ht '5\' 7"'  (1.702 m)  Wt 201 lb (91.173 kg)  BMI 31.47 kg/m2   Mental Status Examination;  Patient is well groomed well  dressed female who appears to be in her stated age.  She is anxious but cooperative.  Her speech is slow, clear, fluent and coherent.  Her thought process logical and goal-directed.  She described her mood is anxious and depressed and her affect is constricted.  There were no delusions, paranoia or any obsessive thoughts.  Her psychomotor activity is normal.  Her fund of knowledge is good.  Patient denies any auditory or visual hallucination.  She denies any active or passive suicidal thoughts or homicidal thoughts.  She is alert and oriented x3.  There were no flight of ideas or any loose association.  Her insight judgment and impulse control is okay.   New problem, with additional work up planned, Review of Psycho-Social Stressors (1), Established  Problem, Worsening (2), Review of Last Therapy Session (1) and Review of Medication Regimen & Side Effects (2)  Assessment: Axis I: Depressive disorder, recurrent.  Alcohol abuse disorder  Axis II: Deferred  Axis III:  Past Medical History  Diagnosis Date  . Headache(784.0)   . Neuropathy   . Anxiety     Axis IV: Moderate   Plan:  I had a long discussion with the patient about prognosis and efficacy.  Recommended to continue Cymbalta 60 mg daily.  Recommended to do more time however I suggested to try Topamax 25 mg at bedtime to help the migraine headaches.  I also suggested if migraine does not help then she showed see a neurologist.  I would recommend to see neurologist at [facility] nephrology.  Patient is not drinking or using any pain medication.  Recommended to see therapist for coping with social skills.  I will see her again in 6 weeks. Time spent 25 minutes.  More than 50% of the time spent in psychoeducation, counseling and coordination of care.  Discuss safety plan that anytime having active suicidal thoughts or homicidal thoughts then patient need to call 911 or go to the local emergency room.    ARFEEN,SYED T.,  MD 11/30/2013

## 2013-12-07 ENCOUNTER — Other Ambulatory Visit: Payer: Self-pay | Admitting: *Deleted

## 2013-12-07 MED ORDER — GABAPENTIN 600 MG PO TABS: ORAL_TABLET | ORAL | Status: DC

## 2013-12-09 ENCOUNTER — Ambulatory Visit (INDEPENDENT_AMBULATORY_CARE_PROVIDER_SITE_OTHER): Payer: BC Managed Care – PPO | Admitting: Licensed Clinical Social Worker

## 2013-12-09 DIAGNOSIS — F320 Major depressive disorder, single episode, mild: Secondary | ICD-10-CM | POA: Diagnosis not present

## 2013-12-09 DIAGNOSIS — F411 Generalized anxiety disorder: Secondary | ICD-10-CM

## 2013-12-12 NOTE — Progress Notes (Signed)
   THERAPIST PROGRESS NOTE  Session Time: 4:08PM-5:05PM  Participation Level: Active  Behavioral Response: Neat and Well GroomedAlertWorthless  Type of Therapy: Individual Therapy  Treatment Goals addressed: Anxiety, Communication: with her husband and Coping  Interventions: CBT, Motivational Interviewing, Strength-based and Supportive  Summary: Sandra Rocha is a 38 y.o. female who presents with symptoms of depression and anxiety. Patient was tearful in this session compared to being more anxious in previous sessions. Patient discussed how she felt that she was "the catch" and she was "perfect" until she was seen in the hospital and how she feels that she is a failure. Patient discussed the pressures of being "perfect [name]" and how she feels that she had lived up to that until recently. Patient reports that she "broke down" crying Wednesday and she has experienced more depression in the past weeks.  Patient discussed being overwhelmed with feelings of guilt and how she harbors emotions because she does not want to complain due to feeling that will take away from her perfect image. Patient discussed the possibility of her husband joining therapy in the future. Patient reports that she has been using coping skills of deep breathing, scrap booking,and she has journaled on some days. Patient reports an increase in her mood when she uses coping skills but reports that she is not consistent. Patient denies SI/HI and psychosis. .   Suicidal/Homicidal: Nowithout intent/plan  Therapist Response: LCSW assessed patients current symptoms. LCSW followed up with patient regarding current events. LCSW assessed patient "breaking down" this past Wednesday and discussed possible coping skills to use when feels of depression arise. LCSW listened actively and asked the patient probing questions. LCSW challenged the patients thoughts of perfection and encouraged the patient think about what "perfection" means to  her. LCSW validated the patients feelings and encouraged the patient to think positive. LCSW discussed positive self talk and how that could set realistic expectations in the future and how making mistakes is an essential part of  Learning and growing. LCSW discussed other coping skills that could be used. LCSW discussed communication skills between the patient and her spouse and how communicating effectively may reduce some of the stress in the relationship. LCSW encouraged the patient to continue to journal and use other coping skills, as well as using positive affirmations to combat negative self talk as appropriate.   Plan: Return again in 2 weeks, continue to use coping skills, take medications as prescribed, keep scheduled appointments. If symptoms of depression increase patient will go to the Emergency Department, discuss moving to weekly sessions if symptoms persist.   Diagnosis:  Generalized Anxiety Disorder and Major Depression, single episode   Sandra Rocha, Sandra Southers M, LCSW 12/12/2013

## 2013-12-14 ENCOUNTER — Ambulatory Visit (INDEPENDENT_AMBULATORY_CARE_PROVIDER_SITE_OTHER): Payer: BC Managed Care – PPO | Admitting: Neurology

## 2013-12-14 ENCOUNTER — Encounter: Payer: Self-pay | Admitting: Neurology

## 2013-12-14 VITALS — BP 118/75 | HR 100 | Ht 67.0 in | Wt 197.0 lb

## 2013-12-14 DIAGNOSIS — M545 Low back pain, unspecified: Secondary | ICD-10-CM | POA: Insufficient documentation

## 2013-12-14 DIAGNOSIS — F321 Major depressive disorder, single episode, moderate: Secondary | ICD-10-CM

## 2013-12-14 DIAGNOSIS — G43009 Migraine without aura, not intractable, without status migrainosus: Secondary | ICD-10-CM

## 2013-12-14 DIAGNOSIS — G43909 Migraine, unspecified, not intractable, without status migrainosus: Secondary | ICD-10-CM | POA: Insufficient documentation

## 2013-12-14 MED ORDER — RIZATRIPTAN BENZOATE 10 MG PO TBDP: 10.0000 mg | ORAL_TABLET | ORAL | Status: DC | PRN

## 2013-12-14 MED ORDER — DICLOFENAC POTASSIUM(MIGRAINE) 50 MG PO PACK: 50.0000 mg | PACK | ORAL | Status: DC | PRN

## 2013-12-14 MED ORDER — TOPIRAMATE 100 MG PO TABS: 100.0000 mg | ORAL_TABLET | Freq: Two times a day (BID) | ORAL | Status: DC

## 2013-12-14 MED ORDER — OXYCODONE-ACETAMINOPHEN 5-325 MG PO TABS: 1.0000 | ORAL_TABLET | Freq: Two times a day (BID) | ORAL | Status: DC | PRN

## 2013-12-14 NOTE — Progress Notes (Signed)
PATIENT: Sandra Rocha DOB: December 02, 1975  HISTORICAL  Sandra Rocha is a 38 years old right-handed Caucasian female, referred by her psychologist Dr. Grant FontanaArfreed and primary care physician Dr. Elder Cyphersonna Gate for evaluation of chronic migraine headaches  She had past medical history of chronic low back pain, has been taking chronic tramadol 50 mg 4 times a day, Neurontin 600 mg 4 tablets each day for low back pain, also reported history of depression anxiety, taking Cymbalta 60 mg daily, next  She reported a history of chronic migraine over the past 20 years, her headache started since high school, seems to be triggered by stress, sleep deprivation, alcohol intake, certain food, such as chocolate, hormone change, around her menstruation period of time,  She used to have headaches occasionally, but over the past 3 years, she has been under a lot of stress, family, and job related, reported increased headaches over past 2 years, at least twice a week, she developed her typical migraine headaches, lateralized severe pounding headaches with associated light noise sensitivity, nauseous, lasting for a few hours, more than 75% of time, her headache would helped by Maxalt, but a quarter of the time, it is so disability taking, she has to resting, sometimes take off from works, 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  She also complains of worsening anxiety, insomnia, fatigue, nighttime snoring, daytime sleepiness, today's ESS score is 18, FSS score is 41  REVIEW OF SYSTEMS: Full 14 system review of systems performed and notable only for weight gain, fatigue, chest pain, blurry vision, loss of vision, eye pain, snoring, feeling hot, headaches, numbness, depression, anxiety, not enough sleep, decreased energy  ALLERGIES: No Known Allergies  HOME MEDICATIONS: Current Outpatient Prescriptions on  File Prior to Visit  Medication Sig Dispense Refill  . DULoxetine (CYMBALTA) 60 MG capsule Take 1 capsule (60 mg total) by mouth daily.  30 capsule  0  . gabapentin (NEURONTIN) 600 MG tablet Take 1 pill in the morning, 1 pill midday, and 2 pills at night.  120 tablet  4  . LO LOESTRIN FE 1 MG-10 MCG / 10 MCG tablet Take 1 tablet by mouth daily.      . rizatriptan (MAXALT-MLT) 10 MG disintegrating tablet       . topiramate (TOPAMAX) 25 MG tablet Take 1 tablet (25 mg total) by mouth 2 (two) times daily.  30 tablet  0  . traMADol (ULTRAM) 50 MG tablet Take 1 tablet (50 mg total) by mouth every 6 (six) hours as needed.  120 tablet  3   No current facility-administered medications on file prior to visit.    PAST MEDICAL HISTORY: Past Medical History  Diagnosis Date  . Headache(784.0)   . Neuropathy   . Anxiety   . Low back pain     PAST SURGICAL HISTORY: Past Surgical History  Procedure Laterality Date  . Cesarean section      FAMILY HISTORY: Family History  Problem Relation Age of Onset  . Migraines Mother   . Migraines Maternal Grandfather     SOCIAL HISTORY:  History   Social History  . Marital Status: Married    Spouse Name: [name]    Number of Children: 2  . Years of Education: college   Occupational History  .     Social History Main Topics  . Smoking status: Never Smoker   .  Smokeless tobacco: Never Used  . Alcohol Use: No  . Drug Use: No  . Sexual Activity: Yes    Birth Control/ Protection: Pill   Other Topics Concern  . Not on file   Social History Narrative   Patient lives at home with her husband [name]([name])   Patient  Works full time IT trainerCPA. CFO    Education college.   Right handed.   Caffeine two cup of coffee daily one glass of sweet tea with dinner.     PHYSICAL EXAM   Filed Vitals:   12/14/13 0854  BP: 118/75  Pulse: 100  Height: 5\' 7"  (1.702 m)  Weight: 197 lb (89.359 kg)    Not recorded    Body mass index is 30.85  kg/(m^2).   Generalized: In no acute distress  Neck: Supple, no carotid bruits   Cardiac: Regular rate rhythm  Pulmonary: Clear to auscultation bilaterally  Musculoskeletal: No deformity  Neurological examination  Mentation: Alert oriented to time, place, history taking, and causual conversation  Cranial nerve II-XII: Pupils were equal round reactive to light. Extraocular movements were full.  Visual field were full on confrontational test. Bilateral fundi were sharp.  Facial sensation and strength were normal. Hearing was intact to finger rubbing bilaterally. Uvula tongue midline.  Head turning and shoulder shrug and were normal and symmetric.Tongue protrusion into cheek strength was normal.  Motor: Normal tone, bulk and strength.  Sensory: Intact to fine touch, pinprick, preserved vibratory sensation, and proprioception at toes.  Coordination: Normal finger to nose, heel-to-shin bilaterally there was no truncal ataxia  Gait: Rising up from seated position without assistance, normal stance, without trunk ataxia, moderate stride, good arm swing, smooth turning, able to perform tiptoe, and heel walking without difficulty.   Romberg signs: Negative  Deep tendon reflexes: Brachioradialis 2/2, biceps 2/2, triceps 2/2, patellar 2/2, Achilles 2/2, plantar responses were flexor bilaterally.   DIAGNOSTIC DATA (LABS, IMAGING, TESTING) - I reviewed patient records, labs, notes, testing and imaging myself where available.  Lab Results  Component Value Date   WBC 12.1* 10/03/2013   HGB 14.1 10/03/2013   HCT 41.6 10/03/2013   MCV 90.0 10/03/2013   PLT 390 10/03/2013      Component Value Date/Time   NA 140 10/03/2013 1426   K 4.3 10/03/2013 1426   CL 104 10/03/2013 1426   CO2 22 10/03/2013 1426   GLUCOSE 139* 10/03/2013 1426   BUN 20 10/03/2013 1426   CREATININE 0.74 10/03/2013 1426   CALCIUM 9.0 10/03/2013 1426   PROT 8.0 10/03/2013 1426   ALBUMIN 4.2 10/03/2013 1426   AST 24 10/03/2013 1426   ALT 34  10/03/2013 1426   ALKPHOS 78 10/03/2013 1426   BILITOT 0.2* 10/03/2013 1426   GFRNONAA >90 10/03/2013 1426   GFRAA >90 10/03/2013 1426   ASSESSMENT AND PLAN  Sandra Rocha is a 38 y.o. female complains of frequent migraine headaches, also complicated by depression, anxiety, insomnia, possible obstructive sleep apnea, normal neurological examinations, she previously responded to Topamax as migraine prevention well,  1, titrating Topamax 100 mg twice a day 2, continue magnesium oxide, riboflavin twice a day as preventive medications 3, Cambia as needed, percocet as rescue therapy. 4. RTC in 4 weeks.     Levert FeinsteinYijun Kala Gassmann, M.D. Ph.D.  [facility] [address], [address] [place], [place] [address] [phone]

## 2013-12-19 ENCOUNTER — Telehealth: Payer: Self-pay | Admitting: Neurology

## 2013-12-19 DIAGNOSIS — IMO0001 Reserved for inherently not codable concepts without codable children: Secondary | ICD-10-CM

## 2013-12-19 DIAGNOSIS — G4710 Hypersomnia, unspecified: Secondary | ICD-10-CM

## 2013-12-19 DIAGNOSIS — E663 Overweight: Secondary | ICD-10-CM

## 2013-12-19 DIAGNOSIS — R0683 Snoring: Secondary | ICD-10-CM

## 2013-12-19 NOTE — Telephone Encounter (Signed)
Dr Terrace ArabiaYan, refers patient for attended sleep study.  Height: 5'7"  Weight: 197lbs  BMI: 30.85  Past Medical History:  Headache(784.0)  .  Neuropathy  .  Anxiety  .  Low back pain    Sleep Symptoms: insomnia, fatigue, nighttime snoring, daytime sleepiness   Epworth Score: today's ESS score is 18    Medication: DULoxetine HCl (Cap DR Particles) CYMBALTA 60 MG Take 1 capsule (60 mg total) by mouth daily. Diclofenac Potassium (Pack) Diclofenac Potassium 50 MG Take 50 mg by mouth as needed. Gabapentin (Tab) NEURONTIN 600 MG Take 1 pill in the morning, 1 pill midday, and 2 pills at night. Norethin-Eth Estrad-Fe Biphas (Tab) LO LOESTRIN FE 1 MG-10 MCG / 10 MCG Take 1 tablet by mouth daily. Oxycodone-Acetaminophen (Tab) PERCOCET/ROXICET 5-325 MG Take 1 tablet by mouth every 12 (twelve) hours as needed for severe pain. Rizatriptan Benzoate (Tablet Dispersible) MAXALT-MLT 10 MG Take 1 tablet (10 mg total) by mouth as needed for migraine. Topiramate (Tab) TOPAMAX 100 MG Take 1 tablet (100 mg total) by mouth 2 (two) times daily. TraMADol HCl (Tab) ULTRAM 50 MG Take 1 tablet (50 mg total) by mouth every 6 (six) hours as needed.    Ins: BCBS   Assessment & Plan:  Philippa ChesterCynthia D Kilcrease is a 38 y.o. female complains of frequent migraine headaches, also complicated by depression, anxiety, insomnia, possible obstructive sleep apnea, normal neurological examinations, she previously responded to Topamax as migraine prevention well,  1, titrating Topamax 100 mg twice a day  2, continue magnesium oxide, riboflavin twice a day as preventive medications  3, Cambia as needed, percocet as rescue therapy.  4. RTC in 4 weeks.    Please review patient information and submit instructions for scheduling and orders for sleep technologist. Thank you.

## 2013-12-23 ENCOUNTER — Ambulatory Visit (HOSPITAL_COMMUNITY): Payer: Self-pay | Admitting: Licensed Clinical Social Worker

## 2014-01-03 ENCOUNTER — Encounter (HOSPITAL_COMMUNITY): Payer: Self-pay | Admitting: Licensed Clinical Social Worker

## 2014-01-04 ENCOUNTER — Ambulatory Visit (INDEPENDENT_AMBULATORY_CARE_PROVIDER_SITE_OTHER): Payer: BC Managed Care – PPO | Admitting: Sports Medicine

## 2014-01-04 ENCOUNTER — Encounter: Payer: Self-pay | Admitting: Sports Medicine

## 2014-01-04 VITALS — BP 127/82 | Ht 67.0 in | Wt 190.0 lb

## 2014-01-04 DIAGNOSIS — M545 Low back pain, unspecified: Secondary | ICD-10-CM

## 2014-01-04 DIAGNOSIS — M5416 Radiculopathy, lumbar region: Secondary | ICD-10-CM | POA: Diagnosis not present

## 2014-01-04 MED ORDER — GABAPENTIN 600 MG PO TABS: ORAL_TABLET | ORAL | Status: DC

## 2014-01-04 MED ORDER — TRAMADOL HCL 50 MG PO TABS: 50.0000 mg | ORAL_TABLET | Freq: Four times a day (QID) | ORAL | Status: DC | PRN

## 2014-01-04 NOTE — Progress Notes (Signed)
Patient ID: Sandra Rocha, female   DOB: Oct 31, 1975, 38 y.o.   MRN: [id number]   Patient with long standing LBP We controlled this with gabapentin and tramadol Both qid at fairly high dose  This is the only thing to date that has made the LBP manageable Bad days she gets numbness from buttocks to post knee on left This is less frequent since gabapentin  Carrying 39 yo son at times will cause a flare  She has had a fall or two that have bothered this  Sitting too long causes back pain  No cough or sneeze pain now  She still wakes up at night sometimes with back and migraines  Flexion exercises help  She was originally followed in neurosurgery by Dr. 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  Med HX - being evaluated for sleep apnea Migraines - Maxalt and still having a lot of migraines/ using topamax for prevention  Cymbalta for anxiety  Physical exam No acute distress and mildly obese BP 127/82 mmHg  Ht 5\' 7"  (1.702 m)  Wt 190 lb (86.183 kg)  BMI 29.75 kg/m2  She has numbness over the left great toe and has lost the toenail She still has good dorsiflexion and plantarflexion strength of the great toe Reflexes are 3+ at the knee and ankle bilaterally She is able to do heel toe and tandem walk On normal walk she has slightly less push off on the left and feels slightly weaker when standing on the left leg  Quadriceps, hamstring, hip flexor and hip abductor strength are all good bilaterally on static testing

## 2014-01-04 NOTE — Assessment & Plan Note (Signed)
This patient continues to have radicular symptoms although not as frequently If she misses the gabapentin  SXs may return  I have a concern that it does affect her walking on the left leg somewhat and she may have some dynamic weakness  I believe we should repeat the MRI since it's been 3 years without resolution

## 2014-01-11 ENCOUNTER — Ambulatory Visit: Payer: BC Managed Care – PPO | Admitting: Podiatry

## 2014-01-11 ENCOUNTER — Other Ambulatory Visit (HOSPITAL_COMMUNITY): Payer: Self-pay | Admitting: *Deleted

## 2014-01-11 DIAGNOSIS — F321 Major depressive disorder, single episode, moderate: Secondary | ICD-10-CM

## 2014-01-11 MED ORDER — DULOXETINE HCL 60 MG PO CPEP: 60.0000 mg | ORAL_CAPSULE | Freq: Every day | ORAL | Status: DC

## 2014-01-18 ENCOUNTER — Encounter: Payer: Self-pay | Admitting: Neurology

## 2014-01-18 ENCOUNTER — Ambulatory Visit (INDEPENDENT_AMBULATORY_CARE_PROVIDER_SITE_OTHER): Payer: BC Managed Care – PPO | Admitting: Neurology

## 2014-01-18 VITALS — BP 121/82 | HR 93 | Ht 67.0 in | Wt 185.0 lb

## 2014-01-18 DIAGNOSIS — M545 Low back pain, unspecified: Secondary | ICD-10-CM

## 2014-01-18 DIAGNOSIS — G43009 Migraine without aura, not intractable, without status migrainosus: Secondary | ICD-10-CM

## 2014-01-18 MED ORDER — OXYCODONE-ACETAMINOPHEN 5-325 MG PO TABS: 1.0000 | ORAL_TABLET | Freq: Two times a day (BID) | ORAL | Status: DC | PRN

## 2014-01-18 MED ORDER — DICLOFENAC POTASSIUM(MIGRAINE) 50 MG PO PACK: 50.0000 mg | PACK | ORAL | Status: DC | PRN

## 2014-01-18 NOTE — Patient Instructions (Signed)
Cambia powder itself can be used as migraine medicine alone,  However if it is not enough, may combine cambia with Maxalt for moderate to severe migraine headaches

## 2014-01-18 NOTE — Progress Notes (Signed)
PATIENT: Sandra Rocha DOB: 1975/12/09  HISTORICAL  Sandra Rocha is a 38 years old right-handed Caucasian female, referred by her psychologist Dr. Grant Fontana and primary care physician Dr. Elder Cyphers for evaluation of chronic migraine headaches  She had past medical history of chronic low back pain, has been taking chronic tramadol 50 mg 4 times a day, Neurontin 600 mg 4 tablets each day for low back pain, also reported history of depression anxiety, taking Cymbalta 60 mg daily  She reported a history of chronic migraine over the past 20 years, her headache started since high school, seems to be triggered by stress, sleep deprivation, alcohol intake, certain food, such as chocolate, hormone change, around her menstruation period of time,  She used to have headaches occasionally, but over the past 3 years, she has been under a lot of stress, family, and job related, reported increased headaches over past 2 years, at least twice a week.  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  She also complains of worsening anxiety, insomnia, fatigue, nighttime snoring, daytime sleepiness, today's ESS score is 18, FSS score is 60  UPDATE Nov 18th 2015: She had flu a few weeks ago, had frequent migraine headaches at that time, has used up 9 tablets of her monthly supply of Maxalt,also her Percocet,   Now her headache has much improved, she is taking Topamax 100 mg twice a day, tolerating it well, had weight loss.  REVIEW OF SYSTEMS: Full 14 system review of systems performed and notable only  for chill, fatigue, fever, light sensitivity, blurred vision, chest tightness, nausea, vomiting, insomnia, frequent wakening, daytime sleepiness, snoring, acting out of dreams, headaches, depression, anxiety  ALLERGIES: No Known Allergies  HOME MEDICATIONS: Current Outpatient Prescriptions on File Prior to Visit  Medication Sig Dispense Refill  . Diclofenac Potassium 50 MG PACK Take 50 mg by mouth as needed. 15 each 11  . DULoxetine (CYMBALTA) 60 MG capsule Take 1 capsule (60 mg total) by mouth daily. 30 capsule 0  . gabapentin (NEURONTIN) 600 MG tablet Take 1 pill in the morning, 1 pill midday, and 2 pills at night. 120 tablet 11  . LO LOESTRIN FE 1 MG-10 MCG / 10 MCG tablet Take 1 tablet by mouth daily.    Marland Kitchen oxyCODONE-acetaminophen (PERCOCET) 5-325 MG per tablet Take 1 tablet by mouth every 12 (twelve) hours as needed for severe pain. 10 tablet 0  . rizatriptan (MAXALT-MLT) 10 MG disintegrating tablet Take 1 tablet (10 mg total) by mouth as needed for migraine. 10 tablet 11  . topiramate (TOPAMAX) 100 MG tablet Take 1 tablet (100 mg total) by mouth 2 (two) times daily. 60 tablet 11  . traMADol (ULTRAM) 50 MG tablet Take 1 tablet (50 mg total) by mouth every 6 (six) hours as needed. 120 tablet 4   No current facility-administered medications on file prior to visit.    PAST MEDICAL HISTORY: Past Medical History  Diagnosis Date  . Headache(784.0)   . Neuropathy   . Anxiety   . Low back pain     PAST SURGICAL  HISTORY: Past Surgical History  Procedure Laterality Date  . Cesarean section      FAMILY HISTORY: Family History  Problem Relation Age of Onset  . Migraines Mother   . Migraines Maternal Grandfather     SOCIAL HISTORY:  History   Social History  . Marital Status: Married    Spouse Name: Sandra Rocha    Number of Children: 2  . Years of Education: college   Occupational History  .     Social History Main Topics  . Smoking status: Never Smoker   . Smokeless  tobacco: Never Used  . Alcohol Use: No  . Drug Use: No  . Sexual Activity: Yes    Birth Control/ Protection: Pill   Other Topics Concern  . Not on file   Social History Narrative   Patient lives at home with her husband Sandra Burdock(Sandra Rocha)   Patient  Works full time IT trainerCPA. CFO    Education college.   Right handed.   Caffeine two cup of coffee daily one glass of sweet tea with dinner.     PHYSICAL EXAM   Filed Vitals:   01/18/14 0906  BP: 121/82  Pulse: 93  Height: 5\' 7"  (1.702 m)  Weight: 185 lb (83.915 kg)    Not recorded      Body mass index is 28.97 kg/(m^2).   Generalized: In no acute distress  Neck: Supple, no carotid bruits   Cardiac: Regular rate rhythm  Pulmonary: Clear to auscultation bilaterally  Musculoskeletal: No deformity  Neurological examination  Mentation: Alert oriented to time, place, history taking, and causual conversation  Cranial nerve II-XII: Pupils were equal round reactive to light. Extraocular movements were full.  Visual field were full on confrontational test. Bilateral fundi were sharp.  Facial sensation and strength were normal. Hearing was intact to finger rubbing bilaterally. Uvula tongue midline.  Head turning and shoulder shrug and were normal and symmetric.Tongue protrusion into cheek strength was normal.  Motor: Normal tone, bulk and strength.  Sensory: Intact to fine touch, pinprick, preserved vibratory sensation, and proprioception at toes.  Coordination: Normal finger to nose, heel-to-shin bilaterally there was no truncal ataxia  Gait: Rising up from seated position without assistance, normal stance, without trunk ataxia, moderate stride, good arm swing, smooth turning, able to perform tiptoe, and heel walking without difficulty.   Romberg signs: Negative  Deep tendon reflexes: Brachioradialis 2/2, biceps 2/2, triceps 2/2, patellar 2/2, Achilles 2/2, plantar responses were flexor bilaterally.   DIAGNOSTIC DATA (LABS, IMAGING,  TESTING) - I reviewed patient records, labs, notes, testing and imaging myself where available.  Lab Results  Component Value Date   WBC 12.1* 10/03/2013   HGB 14.1 10/03/2013   HCT 41.6 10/03/2013   MCV 90.0 10/03/2013   PLT 390 10/03/2013      Component Value Date/Time   NA 140 10/03/2013 1426   K 4.3 10/03/2013 1426   CL 104 10/03/2013 1426   CO2 22 10/03/2013 1426   GLUCOSE 139* 10/03/2013 1426   BUN 20 10/03/2013 1426   CREATININE 0.74 10/03/2013 1426   CALCIUM 9.0 10/03/2013 1426   PROT 8.0 10/03/2013 1426   ALBUMIN 4.2 10/03/2013 1426   AST 24 10/03/2013 1426   ALT 34 10/03/2013 1426   ALKPHOS 78 10/03/2013 1426   BILITOT 0.2* 10/03/2013 1426   GFRNONAA >90 10/03/2013 1426   GFRAA >90 10/03/2013 1426   ASSESSMENT AND PLAN  Sandra Rocha is a 38 y.o. female complains of frequent migraine headaches, also complicated  by depression, anxiety, insomnia, possible obstructive sleep apnea, normal neurological examinations, she  responded to Topamax as migraine prevention, also taking magnesium oxide, riboflavin, overall her headache has much improved  1,ccontinue current preventive medications 2. , Cambia as needed, may combine with Maxalt,  percocet as rescue therapy. 3.  RTC in 3 months   Levert FeinsteinYijun Sholom Dulude, M.D. Ph.D.  [facility] [address], [address] [place], [place] [address] [phone]

## 2014-01-30 ENCOUNTER — Ambulatory Visit (HOSPITAL_COMMUNITY): Payer: Self-pay | Admitting: Psychiatry

## 2014-02-15 ENCOUNTER — Other Ambulatory Visit (HOSPITAL_COMMUNITY): Payer: Self-pay | Admitting: *Deleted

## 2014-02-15 DIAGNOSIS — F321 Major depressive disorder, single episode, moderate: Secondary | ICD-10-CM

## 2014-02-15 MED ORDER — DULOXETINE HCL 60 MG PO CPEP: 60.0000 mg | ORAL_CAPSULE | Freq: Every day | ORAL | Status: DC

## 2014-03-15 ENCOUNTER — Inpatient Hospital Stay: Admission: RE | Admit: 2014-03-15 | Payer: Self-pay | Source: Ambulatory Visit

## 2014-03-17 ENCOUNTER — Ambulatory Visit (INDEPENDENT_AMBULATORY_CARE_PROVIDER_SITE_OTHER): Payer: BC Managed Care – PPO | Admitting: Psychiatry

## 2014-03-17 ENCOUNTER — Encounter (HOSPITAL_COMMUNITY): Payer: Self-pay | Admitting: Psychiatry

## 2014-03-17 VITALS — BP 112/74 | HR 89 | Ht 67.0 in | Wt 184.0 lb

## 2014-03-17 DIAGNOSIS — F321 Major depressive disorder, single episode, moderate: Secondary | ICD-10-CM

## 2014-03-17 DIAGNOSIS — F339 Major depressive disorder, recurrent, unspecified: Secondary | ICD-10-CM

## 2014-03-17 DIAGNOSIS — F1010 Alcohol abuse, uncomplicated: Secondary | ICD-10-CM

## 2014-03-17 MED ORDER — DULOXETINE HCL 60 MG PO CPEP: 60.0000 mg | ORAL_CAPSULE | Freq: Every day | ORAL | Status: DC

## 2014-03-17 NOTE — Progress Notes (Signed)
[facility] [id number] Progress Note   Philippa ChesterCynthia D Ebrahimi [id number] 39 y.o.  03/17/2014 11:50 AM  Chief Complaint:  I like Cymbalta.  It is helping my anxiety.      History of Present Illness:  Aram BeechamCynthia came for her followup appointment.  She is taking her medication as prescribed.  She like Cymbalta which is helping her depression and anxiety.  She has been not involved in any drinking.  She denies any irritability, anger, mood swing.  She recently seen her neurologist and she is scheduled to have an MRI of her back.  She's also getting pain medication for her headaches.  She mentioned her headaches is much better.  Patient is tolerating Cymbalta without any side effects.  She has no tremors shakes or any other concern.  She had a good Christmas and she decided to have scheduled date night with the husband.  She reported improved relationship and things are going very well.  She still have some time attention and focus issues but her energy level is much improved.  She has been not able to see a therapist since that episode moved to [place].  Her appetite is okay.  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  Her son has continental anomalies and has been a big stress in her life.   Suicidal Ideation: No Plan Formed: No Patient has means to carry out plan: No  Homicidal Ideation: No Plan Formed: No Patient has means to carry out plan: No  Past Psychiatric History/Hospitalization(s) Patient diagnosed with postpartum depression 9 years ago and at that time she saw a therapist when she was given Paxil with good response.  She stopped the medication before her second pregnancy and after her son born she resumed with Lexapro.  She was taking Lexapro until July of 2015 she stopped .  During recent months her neurologist has prescribed Wellbutrin, Seroquel, Vistaril for the treatment of migraine headaches.  Patient  denies any history of suicidal attempt, inpatient treatment, psychosis, mania, hallucination , anger or violence.  She is taking Xanax which is prescribed by her OB/GYN. Anxiety: Yes Bipolar Disorder: No Depression: Yes Mania: No Psychosis: No Schizophrenia: No Personality Disorder: No Hospitalization for psychiatric illness: Patient stayed overnight in the emergency room in August 2015 because of severe depression History of Electroconvulsive Shock Therapy: No Prior Suicide Attempts: No  Medical History; Patient has multiple health issues.  She has chronic back pain due to 2 bulging disc, migraine headaches and neuropathy.  Patient denies any seizures.  Review of Systems: Psychiatric: Agitation: No Hallucination: No Depressed Mood: No Insomnia: No Hypersomnia: No Altered Concentration: No Feels Worthless: No Grandiose Ideas: No Belief In Special Powers: No New/Increased Substance Abuse: No Compulsions: No  Neurologic: Headache: Yes Seizure: No Paresthesias: Yes   Musculoskeletal: Strength & Muscle Tone: within normal limits Gait & Station: normal Patient leans: N/A   Outpatient Encounter Prescriptions as of 03/17/2014  Medication Sig  . Diclofenac Potassium 50 MG PACK Take 50 mg by mouth as needed.  . DULoxetine (CYMBALTA) 60 MG capsule Take 1 capsule (60 mg total) by mouth daily.  Marland Kitchen. gabapentin (NEURONTIN) 600 MG tablet Take 1 pill in the morning, 1 pill midday, and 2 pills at night.  . LO LOESTRIN FE 1 MG-10 MCG / 10 MCG tablet Take 1 tablet by mouth daily.  Marland Kitchen. MAGNESIUM ASPARTATE PO Take by mouth daily.  . Multiple Vitamins-Minerals (MULTIVITAMIN  PO) Take by mouth daily.  Marland Kitchen oxyCODONE-acetaminophen (PERCOCET) 5-325 MG per tablet Take 1 tablet by mouth every 12 (twelve) hours as needed for severe pain.  . SUMAtriptan (IMITREX) 100 MG tablet   . Thiamine HCl (VITAMIN B-1 PO) Take by mouth daily.  Marland Kitchen topiramate (TOPAMAX) 100 MG tablet Take 1 tablet (100 mg total) by  mouth 2 (two) times daily.  . traMADol (ULTRAM) 50 MG tablet Take 1 tablet (50 mg total) by mouth every 6 (six) hours as needed.  . [DISCONTINUED] DULoxetine (CYMBALTA) 60 MG capsule Take 1 capsule (60 mg total) by mouth daily.  . [DISCONTINUED] rizatriptan (MAXALT-MLT) 10 MG disintegrating tablet Take 1 tablet (10 mg total) by mouth as needed for migraine.    No results found for this or any previous visit (from the past 2160 hour(s)).    Constitutional:  BP 112/74 mmHg  Pulse 89  Ht  (1.702 m)  Wt 184 lb (83.462 kg)  BMI 28.81 kg/m2   Mental Status Examination;  Patient is well groomed well dressed female who appears to be in her stated age.  She is anxious but cooperative.  Her speech is slow, clear, fluent and coherent.  Her thought process logical and goal-directed.  She described her mood is better and her affect is improved from the past.  There were no delusions, paranoia or any obsessive thoughts.  Her psychomotor activity is normal.  Her fund of knowledge is good.  Patient denies any auditory or visual hallucination.  She denies any active or passive suicidal thoughts or homicidal thoughts.  She is alert and oriented x3.  There were no flight of ideas or any loose association.  Her insight judgment and impulse control is okay.   Established Problem, Stable/Improving (1), Review of Psycho-Social Stressors (1), Decision to obtain old records (1), Review and summation of old records (2), Review of Last Therapy Session (1) and Review of Medication Regimen & Side Effects (2)  Assessment: Axis I: Depressive disorder, recurrent.  Alcohol abuse disorder  Axis II: Deferred  Axis III:  Past Medical History  Diagnosis Date  . Headache(784.0)   . Neuropathy   . Anxiety   . Low back pain     Axis IV: Moderate   Plan:  Patient is doing much better from the past.  She is taking schedule medication for her headaches from the neurologist.  She also is scheduled to have an MRI  of her back .  I reviewed records from her neurologist and her current medication.  I will schedule appointment with Scarlette Calico since she cannot see Jovea who moved to [place].  Continue Cymbalta 60 mg daily.  She does not have any side effects or benefits.  She is getting Topamax from her primary care physician.  Recommended to call us back if she has any question, concern or if he feel worsening of the symptoms.  I will see her again in 3 months. Time spent 25 minutes.  More than 50% of the time spent in psychoeducation, counseling and coordination of care.  Discuss safety plan that anytime having active suicidal thoughts or homicidal thoughts then patient need to call 911 or go to the local emergency room.    Crystalmarie Yasin T., MD 03/17/2014

## 2014-04-20 ENCOUNTER — Encounter: Payer: Self-pay | Admitting: Neurology

## 2014-04-20 ENCOUNTER — Ambulatory Visit (INDEPENDENT_AMBULATORY_CARE_PROVIDER_SITE_OTHER): Payer: BC Managed Care – PPO | Admitting: Neurology

## 2014-04-20 VITALS — BP 120/87 | HR 92 | Ht 67.0 in | Wt 188.0 lb

## 2014-04-20 DIAGNOSIS — G43009 Migraine without aura, not intractable, without status migrainosus: Secondary | ICD-10-CM

## 2014-04-20 MED ORDER — OXYCODONE-ACETAMINOPHEN 5-325 MG PO TABS: 1.0000 | ORAL_TABLET | Freq: Two times a day (BID) | ORAL | Status: DC | PRN

## 2014-04-20 NOTE — Progress Notes (Signed)
PATIENT: Sandra Rocha DOB: 1975/12/24  HISTORICAL  Sandra Rocha is a 39 years old right-handed Caucasian female, referred by her psychologist Dr. Grant Rocha and primary care physician Dr. Elder Cyphersonna Rocha for evaluation of chronic migraine headaches  She had past medical history of chronic low back pain, has been taking chronic tramadol 50 mg 4 times a day, Neurontin 600 mg 4 tablets each day for low back pain, also history of depression anxiety, taking Sandra Rocha 60 mg daily  She reported a history of chronic migraine over the past 20 years, her headache started since high school, seems to be triggered by stress, sleep deprivation, alcohol intake, certain food, such as chocolate, hormone change, around her menstruation period of time,  She used to have headaches occasionally, but over the past 3 years, she has been under a lot of stress, family, and job related, reported increased headaches over past 2 years, at least twice a week.  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  She also complains of worsening anxiety, insomnia, fatigue, nighttime snoring, daytime sleepiness, today's ESS score is 18, FSS score is 2641  UPDATE Nov 18th 2015: She had flu a few weeks ago, had frequent migraine headaches at that time, has used up 9 tablets of her monthly supply of Maxalt,also her Percocet,   Now her headache has much improved, she is taking Topamax 100 mg twice a day, tolerating it well, had weight loss.  UPDATE Feb 18th 2016: Her headache overall has much improved, she is happy with  current management, she is taking Topamax 100 mg twice a day, gabapentin 600 mg 4 tablets a day, Sandra Rocha 60 mg daily, she has 2 -3 bad headache each months, severe pounding left-sided retro-orbital area headaches, movement made it worse, she has light noise sensitivity, nauseous, movement made it worse. She takes Imitrex, Corporate treasurerCambian as needed for headache abortion, Percocet occasionally as rescue therapy  REVIEW OF SYSTEMS: Full 14 system review of systems performed and notable only for  fatigue, headaches, numbness, back pain, neck pain, anxiety ALLERGIES: No Known Allergies  HOME MEDICATIONS: Current Outpatient Prescriptions on File Prior to Visit  Medication Sig Dispense Refill  . Diclofenac Potassium 50 MG PACK Take 50 mg by mouth as needed. 15 each 11  . DULoxetine (Sandra Rocha) 60 MG capsule Take 1 capsule (60 mg total) by mouth daily. 90 capsule 0  . gabapentin (NEURONTIN) 600 MG tablet Take 1 pill in the morning, 1 pill midday, and 2 pills at night. 120 tablet 11  . LO LOESTRIN FE 1 MG-10 MCG / 10 MCG tablet Take 1 tablet by mouth daily.    Marland Kitchen. MAGNESIUM ASPARTATE PO Take by mouth daily.    . Multiple Vitamins-Minerals (MULTIVITAMIN PO) Take by mouth daily.    Marland Kitchen. oxyCODONE-acetaminophen (PERCOCET) 5-325 MG per tablet Take 1 tablet by mouth every 12 (twelve) hours as needed for severe pain. 10 tablet 0  . SUMAtriptan (IMITREX) 100 MG tablet     . Thiamine HCl (VITAMIN B-1 PO) Take by mouth daily.    Marland Kitchen. topiramate (TOPAMAX) 100 MG tablet  Take 1 tablet (100 mg total) by mouth 2 (two) times daily. 60 tablet 11  . traMADol (ULTRAM) 50 MG tablet Take 1 tablet (50 mg total) by mouth every 6 (six) hours as needed. 120 tablet 4   No current facility-administered medications on file prior to visit.    PAST MEDICAL HISTORY: Past Medical History  Diagnosis Date  . Headache(784.0)   . Neuropathy   . Anxiety   . Low back pain     PAST SURGICAL HISTORY: Past Surgical History  Procedure Laterality  Date  . Cesarean section      FAMILY HISTORY: Family History  Problem Relation Age of Onset  . Migraines Mother   . Migraines Maternal Grandfather     SOCIAL HISTORY:  History   Social History  . Marital Status: Married    Spouse Name: Sandra Rocha  . Number of Children: 2  . Years of Education: college   Occupational History  .     Social History Main Topics  . Smoking status: Never Smoker   . Smokeless tobacco: Never Used  . Alcohol Use: No  . Drug Use: No  . Sexual Activity: Yes    Birth Control/ Protection: Pill   Other Topics Concern  . Not on file   Social History Narrative   Patient lives at home with her husband Sandra Rocha)   Patient  Works full time IT trainer. CFO    Education college.   Right handed.   Caffeine two cup of coffee daily one glass of sweet tea with dinner.     PHYSICAL EXAM   Filed Vitals:   04/20/14 0943  BP: 120/87  Pulse: 92  Height:  (1.702 m)  Weight: 188 lb (85.276 kg)    Not recorded      Body mass index is 29.44 kg/(m^2).   Generalized: In no acute distress  Neck: Supple, no carotid bruits   Cardiac: Regular rate rhythm  Pulmonary: Clear to auscultation bilaterally  Musculoskeletal: No deformity  Neurological examination  Mentation: Alert oriented to time, place, history taking, and causual conversation  Cranial nerve II-XII: Pupils were equal round reactive to light. Extraocular movements were full.  Visual field were full on confrontational test. Bilateral fundi were sharp.  Facial sensation and strength were normal. Hearing was intact to finger rubbing bilaterally. Uvula tongue midline.  Head turning and shoulder shrug and were normal and symmetric.Tongue protrusion into cheek strength was normal.  Motor: Normal tone, bulk and strength.  Sensory: Intact to fine touch, pinprick, preserved vibratory sensation, and proprioception at toes.  Coordination: Normal finger to nose, heel-to-shin bilaterally there was no  truncal ataxia  Gait: Rising up from seated position without assistance, normal stance, without trunk ataxia, moderate stride, good arm swing, smooth turning, able to perform tiptoe, and heel walking without difficulty.   Romberg signs: Negative  Deep tendon reflexes: Brachioradialis 2/2, biceps 2/2, triceps 2/2, patellar 2/2, Achilles 2/2, plantar responses were flexor bilaterally.   DIAGNOSTIC DATA (LABS, IMAGING, TESTING) - I reviewed patient records, labs, notes, testing and imaging myself where available.  Lab Results  Component Value Date   WBC 12.1* 10/03/2013   HGB 14.1 10/03/2013   HCT 41.6 10/03/2013   MCV 90.0 10/03/2013   PLT 390 10/03/2013      Component Value Date/Time   NA 140 10/03/2013 1426   K 4.3 10/03/2013 1426   CL 104 10/03/2013 1426   CO2 22 10/03/2013 1426   GLUCOSE 139* 10/03/2013 1426   BUN  20 10/03/2013 1426   CREATININE 0.74 10/03/2013 1426   CALCIUM 9.0 10/03/2013 1426   PROT 8.0 10/03/2013 1426   ALBUMIN 4.2 10/03/2013 1426   AST 24 10/03/2013 1426   ALT 34 10/03/2013 1426   ALKPHOS 78 10/03/2013 1426   BILITOT 0.2* 10/03/2013 1426   GFRNONAA >90 10/03/2013 1426   GFRAA >90 10/03/2013 1426   ASSESSMENT AND PLAN  KARIANN WECKER is a 39 y.o. female complains of frequent migraine headaches, also complicated by depression, anxiety, insomnia, possible obstructive sleep apnea, normal neurological examinations, she  responded to Topamax as migraine prevention, also taking magnesium oxide, riboflavin, overall her headache has much improved  1, Imitrex as needed, may component was cambia, percocet as rescue therapy. 2.  RTC in 6 months   Levert Feinstein, M.D. Ph.D.  [facility] [address] [place], [place] [address] [phone]

## 2014-04-21 ENCOUNTER — Ambulatory Visit (INDEPENDENT_AMBULATORY_CARE_PROVIDER_SITE_OTHER): Payer: BC Managed Care – PPO | Admitting: Neurology

## 2014-04-21 VITALS — BP 126/80 | HR 91

## 2014-04-21 DIAGNOSIS — G4730 Sleep apnea, unspecified: Secondary | ICD-10-CM

## 2014-04-23 NOTE — Sleep Study (Signed)
Please see the scanned sleep study interpretation located in the Procedure tab within the Chart Review section. 

## 2014-05-02 ENCOUNTER — Other Ambulatory Visit: Payer: Self-pay | Admitting: Neurology

## 2014-05-02 ENCOUNTER — Encounter: Payer: Self-pay | Admitting: Neurology

## 2014-05-02 DIAGNOSIS — E663 Overweight: Secondary | ICD-10-CM

## 2014-05-02 DIAGNOSIS — IMO0001 Reserved for inherently not codable concepts without codable children: Secondary | ICD-10-CM

## 2014-05-02 DIAGNOSIS — R0683 Snoring: Secondary | ICD-10-CM

## 2014-05-02 DIAGNOSIS — G4710 Hypersomnia, unspecified: Secondary | ICD-10-CM

## 2014-05-03 ENCOUNTER — Other Ambulatory Visit: Payer: Self-pay | Admitting: Neurology

## 2014-05-03 ENCOUNTER — Encounter: Payer: Self-pay | Admitting: *Deleted

## 2014-05-03 ENCOUNTER — Telehealth: Payer: Self-pay | Admitting: *Deleted

## 2014-05-03 DIAGNOSIS — G4733 Obstructive sleep apnea (adult) (pediatric): Secondary | ICD-10-CM

## 2014-05-03 NOTE — Telephone Encounter (Signed)
Patient was contacted and provided the results of her split night sleep study in which a diagnosis of OSA was made.  Patient was informed that CPAP therapy was recommended for home use.  Numerous questions were addressed and answered regarding her results.  The patient gave verbal permission to mail a copy of her test results.  Patient agreed to be referred to a local DME and was referred to [facility] for processing.  Dr. Terrace ArabiaYan was routed a copy and Dr. Shaune Pollackonna Gates was faxed a copy of the report.   Patient instructed to contact our office 6-8 weeks post set up to schedule a follow up appointment.

## 2014-05-08 DIAGNOSIS — G4730 Sleep apnea, unspecified: Secondary | ICD-10-CM | POA: Insufficient documentation

## 2014-05-25 ENCOUNTER — Other Ambulatory Visit: Payer: Self-pay | Admitting: *Deleted

## 2014-05-25 MED ORDER — TRAMADOL HCL 50 MG PO TABS: 50.0000 mg | ORAL_TABLET | Freq: Four times a day (QID) | ORAL | Status: DC | PRN

## 2014-06-01 ENCOUNTER — Inpatient Hospital Stay: Admission: RE | Admit: 2014-06-01 | Payer: Self-pay | Source: Ambulatory Visit

## 2014-06-19 ENCOUNTER — Ambulatory Visit
Admission: RE | Admit: 2014-06-19 | Discharge: 2014-06-19 | Disposition: A | Discharge status: Home / Self Care | Payer: BC Managed Care – PPO | Source: Ambulatory Visit | Attending: Sports Medicine | Admitting: Sports Medicine

## 2014-06-19 DIAGNOSIS — M545 Low back pain, unspecified: Secondary | ICD-10-CM

## 2014-06-19 DIAGNOSIS — M5416 Radiculopathy, lumbar region: Secondary | ICD-10-CM

## 2014-06-23 ENCOUNTER — Ambulatory Visit (HOSPITAL_COMMUNITY): Payer: Self-pay | Admitting: Psychiatry

## 2014-06-28 ENCOUNTER — Other Ambulatory Visit (HOSPITAL_COMMUNITY): Payer: Self-pay | Admitting: *Deleted

## 2014-06-28 DIAGNOSIS — F321 Major depressive disorder, single episode, moderate: Secondary | ICD-10-CM

## 2014-06-28 MED ORDER — DULOXETINE HCL 60 MG PO CPEP: 60.0000 mg | ORAL_CAPSULE | Freq: Every day | ORAL | Status: DC

## 2014-06-28 NOTE — Telephone Encounter (Signed)
Refill request was sent from [facility].  RX refill request for Duloxetine 60 mg.  Patient was seen on 03-17-14.  Patient was due to follow up on 06-23-14---pt cx due to work.  Patient is due to follow up 07-19-14.  I will send 30 day supply not 90 day supply, enough to cover patient for next appointment.

## 2014-07-19 ENCOUNTER — Ambulatory Visit (HOSPITAL_COMMUNITY): Payer: Self-pay | Admitting: Psychiatry

## 2014-08-10 ENCOUNTER — Encounter: Payer: Self-pay | Admitting: Neurology

## 2014-08-10 ENCOUNTER — Ambulatory Visit (INDEPENDENT_AMBULATORY_CARE_PROVIDER_SITE_OTHER): Payer: BC Managed Care – PPO | Admitting: Neurology

## 2014-08-10 VITALS — BP 139/88 | HR 76 | Ht 67.0 in | Wt 186.0 lb

## 2014-08-10 DIAGNOSIS — G43009 Migraine without aura, not intractable, without status migrainosus: Secondary | ICD-10-CM | POA: Diagnosis not present

## 2014-08-10 DIAGNOSIS — G4733 Obstructive sleep apnea (adult) (pediatric): Secondary | ICD-10-CM | POA: Diagnosis not present

## 2014-08-10 MED ORDER — OXYCODONE-ACETAMINOPHEN 5-325 MG PO TABS: 1.0000 | ORAL_TABLET | Freq: Two times a day (BID) | ORAL | Status: DC | PRN

## 2014-08-10 MED ORDER — RIZATRIPTAN BENZOATE 10 MG PO TBDP: 10.0000 mg | ORAL_TABLET | ORAL | Status: DC | PRN

## 2014-08-10 NOTE — Progress Notes (Signed)
Chief Complaint  Patient presents with  . Migraine    Recently, she has experienced several severe migraine lasting multiple days.  She had to miss two days of work for one episode.  Her home medications helped a little but did not resolve the pain as they normally do.      PATIENT: Sandra Rocha DOB: [birth year]  HISTORICAL  Sandra Rocha is a 39 years old right-handed Caucasian female, referred by her psychologist Dr. Grant Fontana and primary care physician Dr. Elder Cyphers for evaluation of chronic migraine headaches  She had past medical history of chronic low back pain, has been taking chronic tramadol 50 mg 4 times a day, Neurontin 600 mg 4 tablets each day for low back pain, also history of depression anxiety, taking Cymbalta 60 mg daily  She reported a history of chronic migraine over the past 20 years, her headache started since high school, seems to be triggered by stress, sleep deprivation, alcohol intake, certain food, such as chocolate, hormone change, around her menstruation period of time,  She used to have headaches occasionally, but over the past 3 years, she has been under a lot of stress, family, and job related, reported increased headaches over past 2 years, at least twice a week.  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  She also complains of worsening anxiety, insomnia, fatigue, nighttime snoring, daytime sleepiness, today's ESS score is 18, FSS score is 81  UPDATE Nov 18th 2015: She had flu a few weeks ago, had frequent migraine headaches at that time, has  used up 9 tablets of her monthly supply of Maxalt,also her Percocet,   Now her headache has much improved, she is taking Topamax 100 mg twice a day, tolerating it well, had weight loss.  UPDATE Feb 18th 2016: Her headache overall has much improved, she is happy with current management, she is taking Topamax 100 mg twice a day, gabapentin 600 mg 4 tablets a day, Cymbalta 60 mg daily, she has 2 -3 bad headache each months, severe pounding left-sided retro-orbital area headaches, movement made it worse, she has light noise sensitivity, nauseous, movement made it worse. She takes Imitrex, [place] as needed for headache abortion, Percocet occasionally as rescue therapy  UPDATE June 9th 2016: She is overall happy with her current migraine treatment, taking Topamax twice a day, also on polypharmacy for her depression anxiety, Cymbalta 60 mg daily, gabapentin 600 mg 4 times a day,  She used up 10 tablets of Maxalt each month, also use cambian be as needed, Percocet for rescue treatment She had to severe bad migraines, June 26 2014, Aug 01 2014, lasting for few days She also had sleep study in March, using CPAP machine, which has help her sleep quality,   REVIEW OF SYSTEMS: Full 14 system review of systems performed and notable only for  fatigue, headaches, numbness, back pain, neck pain, anxiety ALLERGIES: No Known Allergies  HOME MEDICATIONS: Current Outpatient Prescriptions on File Prior to Visit  Medication Sig Dispense Refill  . Diclofenac Potassium 50 MG PACK Take 50 mg by mouth as needed.  15 each 11  . DULoxetine (CYMBALTA) 60 MG capsule Take 1 capsule (60 mg total) by mouth daily. 30 capsule 0  . gabapentin (NEURONTIN) 600 MG tablet Take 1 pill in the morning, 1 pill midday, and 2 pills at night. 120 tablet 11  . LO LOESTRIN FE 1 MG-10 MCG / 10 MCG tablet Take 1 tablet by mouth daily.    Marland Kitchen MAGNESIUM ASPARTATE PO Take by mouth daily.    . Multiple Vitamins-Minerals (MULTIVITAMIN PO) Take by  mouth daily.    Marland Kitchen oxyCODONE-acetaminophen (PERCOCET) 5-325 MG per tablet Take 1 tablet by mouth every 12 (twelve) hours as needed for severe pain. 10 tablet 0  . SUMAtriptan (IMITREX) 100 MG tablet     . Thiamine HCl (VITAMIN B-1 PO) Take by mouth daily.    Marland Kitchen topiramate (TOPAMAX) 100 MG tablet Take 1 tablet (100 mg total) by mouth 2 (two) times daily. 60 tablet 11  . traMADol (ULTRAM) 50 MG tablet Take 1 tablet (50 mg total) by mouth every 6 (six) hours as needed. 120 tablet 5   No current facility-administered medications on file prior to visit.    PAST MEDICAL HISTORY: Past Medical History  Diagnosis Date  . Headache(784.0)   . Neuropathy   . Anxiety   . Low back pain     PAST SURGICAL HISTORY: Past Surgical History  Procedure Laterality Date  . Cesarean section      FAMILY HISTORY: Family History  Problem Relation Age of Onset  . Migraines Mother   . Migraines Maternal Grandfather     SOCIAL HISTORY:  History   Social History  . Marital Status: Married    Spouse Name: [name]  . Number of Children: 2  . Years of Education: college   Occupational History  .     Social History Main Topics  . Smoking status: Never Smoker   . Smokeless tobacco: Never Used  . Alcohol Use: No  . Drug Use: No  . Sexual Activity: Yes    Birth Control/ Protection: Pill   Other Topics Concern  . 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   Right handed.   Caffeine two cup of coffee daily one glass of sweet tea with dinner.     PHYSICAL EXAM   Filed Vitals:   08/10/14 1553  BP: 139/88  Pulse: 76  Height:  (1.702 m)  Weight: 186 lb (84.369 kg)    Not recorded      Body mass index is 29.12 kg/(m^2). PHYSICAL EXAMNIATION:  Gen: NAD, conversant, well nourised, obese, well groomed                     Cardiovascular: Regular rate rhythm, no peripheral edema,  warm, nontender. Eyes: Conjunctivae clear without exudates or hemorrhage Neck: Supple, no carotid bruise. Pulmonary: Clear to auscultation bilaterally   NEUROLOGICAL EXAM:  MENTAL STATUS: Speech:    Speech is normal; fluent and spontaneous with normal comprehension.  Cognition:    The patient is oriented to person, place, and time;     recent and remote memory intact;     language fluent;     normal attention, concentration,     fund of knowledge.  CRANIAL NERVES: CN II: Visual fields are full to confrontation. Fundoscopic exam is normal with sharp discs and no vascular changes.  Pupil equal round reactive to light CN III, IV, VI: extraocular movement are normal. No ptosis. CN V: Facial sensation is intact to pinprick in all 3 divisions bilaterally. Corneal responses are intact.  CN VII: Face is symmetric with normal eye closure and smile. CN VIII: Hearing is normal to rubbing fingers CN IX, X: Palate elevates symmetrically. Phonation is normal. CN XI: Head turning and shoulder shrug are intact CN XII: Tongue is midline with normal movements and no atrophy.  MOTOR: There is no pronator drift of out-stretched arms. Muscle bulk and tone are normal. Muscle strength is normal.  REFLEXES: Reflexes are 2+ and symmetric at the biceps, triceps, knees, and ankles. Plantar responses are flexor.  SENSORY: Light touch, pinprick, position sense, and vibration sense are intact in fingers and toes.  COORDINATION: Rapid alternating movements and fine finger movements are intact. There is no dysmetria on finger-to-nose and heel-knee-shin. There are no abnormal or extraneous movements.   GAIT/STANCE: Posture is normal. Gait is steady with normal steps, base, arm swing, and turning. Heel and toe walking are normal. Tandem gait is normal.  Romberg is absent.      DIAGNOSTIC DATA (LABS, IMAGING, TESTING) - I reviewed patient records, labs, notes, testing and imaging myself where  available.  Lab Results  Component Value Date   WBC 12.1* 10/03/2013   HGB 14.1 10/03/2013   HCT 41.6 10/03/2013   MCV 90.0 10/03/2013   PLT 390 10/03/2013      Component Value Date/Time   NA 140 10/03/2013 1426   K 4.3 10/03/2013 1426   CL 104 10/03/2013 1426   CO2 22 10/03/2013 1426   GLUCOSE 139* 10/03/2013 1426   BUN 20 10/03/2013 1426   CREATININE 0.74 10/03/2013 1426   CALCIUM 9.0 10/03/2013 1426   PROT 8.0 10/03/2013 1426   ALBUMIN 4.2 10/03/2013 1426   AST 24 10/03/2013 1426   ALT 34 10/03/2013 1426   ALKPHOS 78 10/03/2013 1426   BILITOT 0.2* 10/03/2013 1426   GFRNONAA >90 10/03/2013 1426   GFRAA >90 10/03/2013 1426   ASSESSMENT AND PLAN  Sandra Rocha is a 39 y.o. female with history of frequent migraine headaches, also complicated by depression, anxiety, insomnia,obstructive sleep apnea, normal neurological examinations, she  responded well to Topamax as migraine prevention, also taking magnesium oxide, riboflavin, overall her headache has much improved  1, Migraine headaches, keep current preventive medications, Topamax 100 mg twice a day, 2, Maxalt, [place] as needed. Percocet as rescue treatment. 3. Obstructive sleep apnea, she tolerated the CPAP machine better, which does help her, referred her to sleep specialist Dr. Vickey Huger for follow up   Levert Feinstein, M.D. Ph.D.  [facility] [address], [address] [place], [place] [address] [phone]

## 2014-08-29 ENCOUNTER — Ambulatory Visit (INDEPENDENT_AMBULATORY_CARE_PROVIDER_SITE_OTHER): Payer: BC Managed Care – PPO | Admitting: Sports Medicine

## 2014-08-29 ENCOUNTER — Encounter: Payer: Self-pay | Admitting: Sports Medicine

## 2014-08-29 VITALS — BP 117/46 | Ht 67.0 in | Wt 180.0 lb

## 2014-08-29 DIAGNOSIS — M5416 Radiculopathy, lumbar region: Secondary | ICD-10-CM

## 2014-08-29 DIAGNOSIS — M545 Low back pain: Secondary | ICD-10-CM | POA: Diagnosis not present

## 2014-08-29 DIAGNOSIS — M79605 Pain in left leg: Secondary | ICD-10-CM

## 2014-08-29 MED ORDER — KETOROLAC TROMETHAMINE 60 MG/2ML IM SOLN
60.0000 mg | Freq: Once | INTRAMUSCULAR | Status: AC
  Administered 2014-08-29: 60 mg via INTRAMUSCULAR

## 2014-08-29 MED ORDER — OXYCODONE-ACETAMINOPHEN 5-325 MG PO TABS: 1.0000 | ORAL_TABLET | Freq: Two times a day (BID) | ORAL | Status: DC | PRN

## 2014-08-29 NOTE — Progress Notes (Signed)
Patient ID: Sandra Rocha, female   DOB: 02/21/1976, 39 y.o.   MRN: [id number]  History of LBP First started in college Had been pretty well controlled with high doses tramadol and gabapentin However more recent radiation down into RT leg  MRI in 4/16 showed some disk bulging but no clear cause of worsening pain  Last week her 40 year old son jumped up to be picked up and she had acute onset of worse pain in low back, RT buttocks and into RT thigh. No red flags.  She is getting a neurosurgical consult next week since we have had to keep her on high dose meds and she has not really had sufficient pain relief to function as fully as she would like  Pain on this occasion pretty severe so came for acute care  Exam In some obvious pain and very stiff BP 117/46 mmHg  Ht 5\' 7"  (1.702 m)  Wt 180 lb (81.647 kg)  BMI 28.19 kg/m2  No true TTP over lumbar area Heel to toe walk normal Tandem walk normal but some balance issues Testing of strengthLl4 to S2 seems preserved  SLR is + 30 deg RT SLR on LT causes pain on RT at 40 deg  FABER not painful SIJ are movable  Reflexes Ankle 2+ RT knee 3+ Lt knee is hyperreflexic - 4+

## 2014-08-29 NOTE — Assessment & Plan Note (Signed)
Now with radiation to RT leg on an acute basis makes me concerned for Disk injury  Toradol 60 IM Oxycodone prn for 1 week Keep up her qid tramadol and Gabapentin  Chronic radiation to LT leg and cause of LBP to be assess with NS consult to Dr Lovell SheehanJenkins next week

## 2014-09-21 ENCOUNTER — Telehealth: Payer: Self-pay

## 2014-09-21 ENCOUNTER — Ambulatory Visit: Payer: BC Managed Care – PPO | Admitting: Neurology

## 2014-09-21 NOTE — Telephone Encounter (Signed)
Pt did not show for their appt with Dr. Dohmeier today.  

## 2014-09-22 ENCOUNTER — Encounter: Payer: Self-pay | Admitting: Neurology

## 2014-10-26 ENCOUNTER — Ambulatory Visit (INDEPENDENT_AMBULATORY_CARE_PROVIDER_SITE_OTHER): Payer: BC Managed Care – PPO | Admitting: Psychiatry

## 2014-10-26 ENCOUNTER — Telehealth: Payer: Self-pay | Admitting: Neurology

## 2014-10-26 ENCOUNTER — Encounter (HOSPITAL_COMMUNITY): Payer: Self-pay | Admitting: Psychiatry

## 2014-10-26 VITALS — BP 119/73 | HR 95 | Ht 67.0 in | Wt 193.0 lb

## 2014-10-26 DIAGNOSIS — F1010 Alcohol abuse, uncomplicated: Secondary | ICD-10-CM

## 2014-10-26 DIAGNOSIS — F321 Major depressive disorder, single episode, moderate: Secondary | ICD-10-CM

## 2014-10-26 DIAGNOSIS — G894 Chronic pain syndrome: Secondary | ICD-10-CM

## 2014-10-26 MED ORDER — DULOXETINE HCL 60 MG PO CPEP: 60.0000 mg | ORAL_CAPSULE | Freq: Every day | ORAL | Status: DC

## 2014-10-26 NOTE — Telephone Encounter (Signed)
Patient called requesting refill for oxyCODONE-acetaminophen (PERCOCET) 5-325 MG per tablet . Patient states she is in town today and inquiring if she could pick it up today. Patient can be reached at [phone]. Patient advised RX will be ready within 24 hours unless otherwise informed by RN.

## 2014-10-26 NOTE — Progress Notes (Signed)
[facility] [id number] Progress Note   Sandra Rocha [id number] 39 y.o.  10/26/2014 4:51 PM  Chief Complaint:  I'm taking Cymbalta.  My depression is not as bad however I still feel very tired.    History of Present Illness:  Sandra Rocha came for her followup appointment.  She was last seen in January and she missed appointment in March.  She is taking her medication as prescribed however she feel still very tired and exhausted.  She is taking Cymbalta which is helping her depression.  She has recently sleep study and she is using CPAP and she was hoping her energy level will get improved but she has not noticed that.  She was also frustrated because no one has discussed sleep study with her so far but she received CPAP machine.  She has been very busy in the summer.  She has multiple commitment .  She had a MRI which shows bulging disc and she was referred to a neurosurgeon idiot she has not scheduled appointment so far.  She was unable to see therapist which was recommended on the last visit.  She like to reschedule again.  She still have issues with attention, focus.  She has noticed frequent napping during the meetings and extremely tired during the day.  She endorse Cymbalta help her depression but wondering what else can help her fatigue and attention issues.  She is taking multiple medication for headaches.  She is seeing Dr. Terrace Arabia for headaches.  She scheduled to see Dr. Kandyce Rud in few weeks.  She is concerned about her son who recently diagnosed with kidney cyst.  Patient denies any hallucination, paranoia, crying spells or any feeling of hopelessness or worthlessness.  She denies any suicidal thoughts or homicidal thought.  She has no tremors or shakes.  She lives with her husband. Her appetite is okay.  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  Her son has continental anomalies and has been a big stress in her  life.  She has stopped drinking alcohol.  She is not using any drugs.  Suicidal Ideation: No Plan Formed: No Patient has means to carry out plan: No  Homicidal Ideation: No Plan Formed: No Patient has means to carry out plan: No  Past Psychiatric History/Hospitalization(s) Patient diagnosed with postpartum depression and at that time she saw a therapist when she was given Paxil with good response.  She stopped the medication before her second pregnancy and after her son born she resumed with Lexapro.  She was taking Lexapro until July of 2015 she stopped .  During recent months her neurologist has prescribed Wellbutrin, Seroquel, Vistaril for the treatment of migraine headaches.  Patient denies any history of suicidal attempt, inpatient treatment, psychosis, mania, hallucination , anger or violence.  She is taking Xanax which is prescribed by her OB/GYN. Anxiety: Yes Bipolar Disorder: No Depression: Yes Mania: No Psychosis: No Schizophrenia: No Personality Disorder: No Hospitalization for psychiatric illness: Patient stayed overnight in the emergency room in August 2015 because of severe depression History of Electroconvulsive Shock Therapy: No Prior Suicide Attempts: No  Medical History; Patient has multiple health issues.  She has chronic back pain due to 2 bulging disc, migraine headaches and neuropathy.  Patient denies any seizures.  Review of Systems  Constitutional: Positive for malaise/fatigue.       Feeling tired  Cardiovascular: Negative for chest pain and palpitations.  Skin: Negative for  itching and rash.  Psychiatric/Behavioral: Negative for depression, suicidal ideas, hallucinations and substance abuse. The patient is not nervous/anxious.     Psychiatric: Agitation: No Hallucination: No Depressed Mood: No Insomnia: No Hypersomnia: No Altered Concentration: No Feels Worthless: No Grandiose Ideas: No Belief In Special Powers: No New/Increased Substance Abuse:  No Compulsions: No  Neurologic: Headache: Yes Seizure: No Paresthesias: Yes   Musculoskeletal: Strength & Muscle Tone: within normal limits Gait & Station: normal Patient leans: N/A   Outpatient Encounter Prescriptions as of 10/26/2014  Medication Sig  . Diclofenac Potassium 50 MG PACK Take 50 mg by mouth as needed.  . DULoxetine (CYMBALTA) 60 MG capsule Take 1 capsule (60 mg total) by mouth daily.  Marland Kitchen gabapentin (NEURONTIN) 600 MG tablet Take 1 pill in the morning, 1 pill midday, and 2 pills at night.  . LO LOESTRIN FE 1 MG-10 MCG / 10 MCG tablet Take 1 tablet by mouth daily.  Marland Kitchen MAGNESIUM ASPARTATE PO Take by mouth daily.  . Multiple Vitamins-Minerals (MULTIVITAMIN PO) Take by mouth daily.  Marland Kitchen oxyCODONE-acetaminophen (PERCOCET) 5-325 MG per tablet Take 1 tablet by mouth every 12 (twelve) hours as needed for severe pain.  . rizatriptan (MAXALT-MLT) 10 MG disintegrating tablet Take 1 tablet (10 mg total) by mouth as needed. May repeat in 2 hours if needed  . Thiamine HCl (VITAMIN B-1 PO) Take by mouth daily.  Marland Kitchen topiramate (TOPAMAX) 100 MG tablet Take 1 tablet (100 mg total) by mouth 2 (two) times daily.  . traMADol (ULTRAM) 50 MG tablet Take 1 tablet (50 mg total) by mouth every 6 (six) hours as needed.  . [DISCONTINUED] DULoxetine (CYMBALTA) 60 MG capsule Take 1 capsule (60 mg total) by mouth daily.  . [DISCONTINUED] SUMAtriptan (IMITREX) 100 MG tablet    No facility-administered encounter medications on file as of 10/26/2014.    No results found for this or any previous visit (from the past 2160 hour(s)).    Constitutional:  BP 119/73 mmHg  Pulse 95  Ht 5\' 7"  (1.702 m)  Wt 193 lb (87.544 kg)  BMI 30.22 kg/m2   Mental Status Examination;  Patient is well groomed well dressed female who appears to be in her stated age.  She is tired but cooperative.  Her speech is slow, clear, fluent and coherent.  Her thought process logical and goal-directed.  She described her mood  euthymic and her affect is appropriate. There were no delusions, paranoia or any obsessive thoughts.  Her psychomotor activity is normal.  Her fund of knowledge is good.  Patient denies any auditory or visual hallucination.  She denies any active or passive suicidal thoughts or homicidal thoughts.  She is alert and oriented x3.  There were no flight of ideas or any loose association.  Her insight judgment and impulse control is okay.   Established Problem, Stable/Improving (1), Review of Psycho-Social Stressors (1), Decision to obtain old records (1), Review and summation of old records (2), New Problem, with no additional work-up planned (3), Review of Last Therapy Session (1) and Review of Medication Regimen & Side Effects (2)  Assessment: Axis I: Depressive disorder, recurrent.  Alcohol abuse disorder  Axis II: Deferred  Axis III:  Past Medical History  Diagnosis Date  . Headache(784.0)   . Neuropathy   . Anxiety   . Low back pain     Plan:  I reviewed records from neurology, collateral information and her current medication.  She is taking multiple medication for her headaches.  Discuss  Cymbalta side effects however she does not feel symptoms coming from Cymbalta.  We also talked about low dose stimulant however given the history of headaches I would deferred any stimulant.  I recommended to discuss with the neurologist about sleep study to rule out any narcolepsy.  She's been complaining frequent napping during the day meetings and very tired.  Patient may require further workup to rule out organic causes of above symptoms.  I also suggested to see Tomma Lightning for counseling .  Patient will scheduled appointment with Scarlette Calico.  In the meantime we will continue Cymbalta 60 mg daily.  She is getting Topamax, Imitrex, Maxalt, Neurontin and when necessary Percocet for other providers.  Discussed polypharmacy.  Recommended to call us back if she feels worsening of the symptom.  Follow-up in 3 months.   Time spent 25 minutes.  More than 50% of the time spent in psychoeducation, counseling and coordination of care.  Discuss safety plan that anytime having active suicidal thoughts or homicidal thoughts then patient need to call 911 or go to the local emergency room.    Aveya Beal T., MD 10/26/2014

## 2014-10-26 NOTE — Telephone Encounter (Signed)
Patient is requesting a refill on Oxycodone.  Last prescribed by [place] at [facility] on 06/09 for #30.  Patient got a Rx from Dr Darrick Penna on 06/28 for #30. Has appt on 09/27 with Dr Vickey Huger for CPAP, and another appt scheduled with CM on 12/08, follow up.  Would you like to refill Rx?  Please advsie.  Thank you.

## 2014-10-27 NOTE — Telephone Encounter (Signed)
Patient called checking status of Rx refill

## 2014-10-27 NOTE — Telephone Encounter (Signed)
Dr Terrace Arabia is out of the office today.  Forwarding request to [facility] for review.   Patient is requesting a refill on Oxycodone. Last prescribed by [place] at [facility] on 06/09 for #30. Patient got a Rx from Dr Darrick Penna on 06/28 for #30.  No showed appt on 07/21, however, now has appt on 09/27 with Dr Vickey Huger for CPAP, and another appt scheduled with CM on 12/08, follow up.   I called the patient back, got no answer.  Left message.

## 2014-10-30 MED ORDER — OXYCODONE-ACETAMINOPHEN 5-325 MG PO TABS: 1.0000 | ORAL_TABLET | Freq: Two times a day (BID) | ORAL | Status: DC | PRN

## 2014-10-30 NOTE — Telephone Encounter (Signed)
Pt is calling about her rx refill for Oxycondone. She states that Dr. Terrace Arabia is the one who prescribed it to her first. May call pt at [phone]. She would like to speak with the nurse as well. Pt is upset that she called Thursday of last week and no one has called her. I explained to her that Shanda Bumps called and left a message. She then expressed that she wanted a call back today. I explained that I would set this as a high priority and it may take 24-48 hrs for a call back. She then hung up on me.

## 2014-10-30 NOTE — Telephone Encounter (Signed)
Forward to Dr Mechele Dawley- [facility] filled this medication today , 30 tabs, no refill.

## 2014-10-30 NOTE — Telephone Encounter (Signed)
Oxycodone rx. up front [facility] 

## 2014-10-30 NOTE — Telephone Encounter (Signed)
I spoke with patient who is aware her Rx is ready for pick up.

## 2014-10-30 NOTE — Telephone Encounter (Signed)
Dr Terrace Arabia is out of the office today. Forwarding request to [facility] for review.  Patient is requesting a refill on Oxycodone. Last prescribed by [place] at [facility] on 06/09 for #30. Patient got a Rx from Dr Darrick Penna on 06/28 for #30.  No showed appt on 07/21, however, now has appt on 09/27 with Dr Vickey Huger for CPAP, and another appt scheduled with CM on 12/08, follow up.  WID, Please advise.  Thank you.   I called the patient back to advise message has been forwarded to [facility].  She expressed understanding and appreciation.  She would like a call back once the WID has decided if they will prescribed med.

## 2014-11-02 ENCOUNTER — Telehealth: Payer: Self-pay | Admitting: *Deleted

## 2014-11-02 ENCOUNTER — Other Ambulatory Visit: Payer: Self-pay | Admitting: *Deleted

## 2014-11-02 MED ORDER — TRAMADOL HCL 50 MG PO TABS: 50.0000 mg | ORAL_TABLET | Freq: Four times a day (QID) | ORAL | Status: DC | PRN

## 2014-11-02 MED ORDER — GABAPENTIN 600 MG PO TABS: ORAL_TABLET | ORAL | Status: DC

## 2014-11-02 NOTE — Telephone Encounter (Signed)
Both called in

## 2014-11-07 ENCOUNTER — Ambulatory Visit: Payer: BC Managed Care – PPO | Admitting: Neurology

## 2014-11-08 ENCOUNTER — Encounter: Payer: Self-pay | Admitting: Neurology

## 2014-11-08 ENCOUNTER — Ambulatory Visit: Payer: BC Managed Care – PPO | Admitting: Neurology

## 2014-11-27 ENCOUNTER — Ambulatory Visit (INDEPENDENT_AMBULATORY_CARE_PROVIDER_SITE_OTHER): Payer: BC Managed Care – PPO | Admitting: Clinical

## 2014-11-27 ENCOUNTER — Encounter (HOSPITAL_COMMUNITY): Payer: Self-pay | Admitting: Clinical

## 2014-11-27 DIAGNOSIS — F1021 Alcohol dependence, in remission: Secondary | ICD-10-CM

## 2014-11-27 DIAGNOSIS — F331 Major depressive disorder, recurrent, moderate: Secondary | ICD-10-CM

## 2014-11-27 DIAGNOSIS — F1099 Alcohol use, unspecified with unspecified alcohol-induced disorder: Secondary | ICD-10-CM

## 2014-11-27 NOTE — Progress Notes (Signed)
Patient:   Sandra Rocha   DOB:   [birth year]  MR Number:  [id number]  Location:  [facility] BEHAVIORAL HEALTH OUTPATIENT THERAPY [place] [id number] [place] [address] Dept: [phone]           Date of Service:   11/27/2014  Start Time:   10:12 End Time:   11:02  Provider/Observer:  Erby Pian Counselor       Billing Code/Service: [phone]  Behavioral Observation: Philippa Chester  presents as a 39 y.o.-year-old Caucasian Female who appeared her stated age. her dress was Appropriate and she was Casual and her manners were Appropriate to the situation.  There were not any physical disabilities noted.  she displayed an appropriate level of cooperation and motivation.    Interactions:    Active   Attention:   normal  Memory:   normal  Speech (Volume):  normal  Speech:   normal pitch and normal volume  Thought Process:  Coherent and Relevant  Though Content:  WNL  Orientation:   person, place and situation  Judgment:   Good  Planning:   Fair  Affect:    Appropriate  Mood:    Depressed  Insight:   Good  Intelligence:   normal  Chief Complaint:     Chief Complaint  Patient presents with  . Anxiety  . Depression    Reason for Service:  Referred by Dr. Lolly Mustache  Current Symptoms:  Depression and anxiety  Source of Distress:              My job, family stress, just balancing it all  Marital Status/Living: Married - [name] 14 years. 2 children 74 yo daughter Catie, and 3 yo son Matty. 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  "Growing up was good. My parents were married and I had an older brother. My father  was a Company secretary. 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 My Mom got pregnant in high school, so they got married."  "Both Grandparents were alcoholics."  Lives with husband and children.     "I have some guilt because my son has syndrome  Which requires cancer screening 4x a year. I also have that stress and worry 4x a year."   Natural/Informal Support:                           My husband, my Dad and my friends   Substance Use:  There is a documented history of alcohol abuse confirmed by the patient.   "I was drinking a lot last year. Stress was leading one glass of wine into a bottle. I just stopped. Alcohol abuse runs in my family. I got tired of the way it made me feel. I just didn't want it to wreck my life." "I could  see how that was also causing problems with my pain meds."  "It was a blow. It did give me a new appreciation that if it could happen to me it could hppen to anyone." Aug 2015 - [facility] - was drinking too much to deal with depression and stress. Overnight stay.   Medical History:   Past Medical History  Diagnosis Date  . Headache(784.0)   . Neuropathy   . Anxiety   . Low back pain           Medication List       This list is accurate as of: 11/27/14 10:21 AM.  Always use your most recent med list.               Diclofenac Potassium 50 MG Pack  Take 50 mg by mouth as needed.     DULoxetine 60 MG capsule  Commonly known as:  CYMBALTA  Take 1 capsule (60 mg total) by mouth daily.     gabapentin 600 MG tablet  Commonly known as:  NEURONTIN  Take 1 pill in the morning, 1 pill midday, and 2 pills at night.     LO LOESTRIN FE 1 MG-10 MCG / 10 MCG tablet  Generic drug:  Norethindrone-Ethinyl Estradiol-Fe Biphas  Take 1 tablet by mouth daily.     MAGNESIUM ASPARTATE PO  Take  by mouth daily.     MULTIVITAMIN PO  Take by mouth daily.     oxyCODONE-acetaminophen 5-325 MG per tablet  Commonly known as:  PERCOCET  Take 1 tablet by mouth every 12 (twelve) hours as needed for severe pain.     rizatriptan 10 MG disintegrating tablet  Commonly known as:  MAXALT-MLT  Take 1 tablet (10 mg total) by mouth as needed. May repeat in 2 hours if needed     topiramate 100 MG tablet  Commonly known as:  TOPAMAX  Take 1 tablet (100 mg total) by mouth 2 (two) times daily.     traMADol 50 MG tablet  Commonly known as:  ULTRAM  Take 1 tablet (50 mg total) by mouth every 6 (six) hours as needed.     VITAMIN B-1 PO  Take by mouth daily.              Sexual History:   History  Sexual Activity  . Sexual Activity: Yes  . Birth Control/ Protection: Pill     Abuse/Trauma History: No childhood abuse     No childhood trauma     No abuse as an adult  Psychiatric History:  Aug 2015 - [facility] - was drinking too much to deal with depression and stress. Overnight stay.  Strengths:   "Pretty strong willed. I have a backbone. Level headed"  Recovery Goals:  "I want to feel better. I want to be back to my old [place] self. I have a lot of stress I am spread real thin. I don't like it. I want to work on myself."  Hobbies/Interests:               "Reading, jogging."   Challenges/Barriers: "I need to find some balance >    Family Med/Psych History:  Family History  Problem Relation Age of Onset  . Migraines Mother   . Depression Mother   . Migraines Maternal Grandfather   . Depression Father     Risk of Suicide/Violence: low Denies any suicidal or homicidal ideation. "have had fleeting thoughts of what's the point."  History of  Suicide/Violence:  None  Psychosis:   "None  Diagnosis:    Major depressive disorder, recurrent episode, moderate  Alcohol use disorder, moderate, in sustained remission  Impression/DX:  SORIYA WORSTER is a 39 y.o.-year-old  Caucasian Female who presents with Major depressive disorder and alcohol use disorder, moderate in sustained remission. Larae shared that 2 years ago her drinking had increased to daily and that one glass a wine turned into a bottle on a night. She shared that it had gotten to the point that she went to the hospital for assistance. She shared that she did not need long term care but was kept over night. Sabrine reported that her grandparents were alcohol dependent. She shared that she quit alcohol after her hospital stay in Aug 2015.  Romilda reports a long history of depression. She stated that she had experienced post partum depression after her children were born. She shared that when she is depressed she feel hopeless, cry sometimes or feel like on verge of tears, negative self talk, feel guilty, need for sleep more, eatting yo-yos."everything is just more difficult then it should be." She denies any mania, hallucination, she reports that she  "likes things to be a certain way." but that this is not time consuming nor does it interfere with her life    Recommendation/Plan: Individual therapy 1x every 1-2 weeks, to become less frequent as symptoms improve. Follow safety plan as needed.

## 2014-11-28 ENCOUNTER — Ambulatory Visit: Payer: BC Managed Care – PPO | Admitting: Neurology

## 2014-12-04 ENCOUNTER — Ambulatory Visit (HOSPITAL_COMMUNITY): Payer: Self-pay | Admitting: Clinical

## 2014-12-21 ENCOUNTER — Encounter (HOSPITAL_COMMUNITY): Payer: Self-pay | Admitting: Clinical

## 2014-12-21 ENCOUNTER — Ambulatory Visit (INDEPENDENT_AMBULATORY_CARE_PROVIDER_SITE_OTHER): Payer: BC Managed Care – PPO | Admitting: Clinical

## 2014-12-21 DIAGNOSIS — F1021 Alcohol dependence, in remission: Secondary | ICD-10-CM

## 2014-12-21 DIAGNOSIS — F331 Major depressive disorder, recurrent, moderate: Secondary | ICD-10-CM

## 2014-12-21 NOTE — Progress Notes (Signed)
   THERAPIST PROGRESS NOTE  Session Time: 12:08 - 1:02  Participation Level: Active  Behavioral Response: CasualAlertAnxious  Type of Therapy: Individual Therapy  Treatment Goals addressed: improve psychiatric symptoms, elevate mood (decreased sense of dread and increase enjoyment of activities), improve unhelpful thought patterns, decrease irritability (decrease in flying off the handle)  Interventions: CBT and Motivational Interviewing Grounding and Mindfulness techniques  Summary: Sandra Rocha is a 39 y.o. female who presents with Major Depressive Disorder, recurrent episode, moderate  Suicidal/Homicidal: No - without intent/plan  Therapist Response: Melodie met with clinician for an individual session. Sharmane discussed her current life events, her psychiatric symptoms, and her goals for therapy. Damyia shared that she is wanting to feel better and be able to deal with her emotions more effectively. Brigett shared that she has been very irritable lately. Maila shared examples of how this irritation affects her daily life and how she acts on it. Adriana identified some of the sources of her irritation.  Client and clinician discussed some basic CBT concepts. Client and clinician discussed how her thoughts affect her mood and her actions. Client and clinician began a discussion on how she could begin to challenge her thoughts. Clinician introduced grounding and mindfulness techniques. Clinician explained the process the purpose and the practice of the techniques. Client and clinician discussed a few techniques and detail and practice them together. Olga agreed to practice the grounding techniques and tell next session.   Plan: Return again in 1 weeks.  Diagnosis:     Axis I: Major Depressive Disorder, recurrent episode, moderate   Nylee Barbuto A, LCSW 12/21/2014

## 2014-12-28 ENCOUNTER — Encounter: Payer: Self-pay | Admitting: Nurse Practitioner

## 2014-12-28 ENCOUNTER — Ambulatory Visit (INDEPENDENT_AMBULATORY_CARE_PROVIDER_SITE_OTHER): Payer: BC Managed Care – PPO | Admitting: Nurse Practitioner

## 2014-12-28 VITALS — BP 115/77 | HR 99 | Ht 67.0 in | Wt 188.6 lb

## 2014-12-28 DIAGNOSIS — F321 Major depressive disorder, single episode, moderate: Secondary | ICD-10-CM

## 2014-12-28 DIAGNOSIS — G43009 Migraine without aura, not intractable, without status migrainosus: Secondary | ICD-10-CM

## 2014-12-28 DIAGNOSIS — G4730 Sleep apnea, unspecified: Secondary | ICD-10-CM

## 2014-12-28 DIAGNOSIS — G894 Chronic pain syndrome: Secondary | ICD-10-CM

## 2014-12-28 MED ORDER — TOPIRAMATE 100 MG PO TABS: 100.0000 mg | ORAL_TABLET | Freq: Two times a day (BID) | ORAL | Status: DC

## 2014-12-28 MED ORDER — OXYCODONE-ACETAMINOPHEN 5-325 MG PO TABS: 1.0000 | ORAL_TABLET | Freq: Two times a day (BID) | ORAL | Status: DC | PRN

## 2014-12-28 NOTE — Patient Instructions (Signed)
Continue Topamax at current dose Continue Maxalt prn  Try Onzetra nasal spray as directed Percocet as rescue RX given

## 2014-12-28 NOTE — Progress Notes (Signed)
[facility]  PATIENT: TABBITHA JANVRIN DOB: 1976-01-23   REASON FOR VISIT: Follow-up for migraine obstructive sleep apnea HISTORY FROM: Patient    HISTORY OF PRESENT ILLNESS: HISTORYCynthia D Rafferty is a 39 years old right-handed Caucasian female, referred by her psychologist Dr. Grant Fontana and primary care physician Dr. Elder Cyphers for evaluation of chronic migraine headaches She had past medical history of chronic low back pain, has been taking chronic tramadol 50 mg 4 times a day, Neurontin 600 mg 4 tablets each day for low back pain, also history of depression anxiety, taking Cymbalta 60 mg daily She reported a history of chronic migraine over the past 20 years, her headache started since high school, seems to be triggered by stress, sleep deprivation, alcohol intake, certain food, such as chocolate, hormone change, around her menstruation period of time, She used to have headaches occasionally, but over the past 3 years, she has been under a lot of stress, family, and job related, reported increased headaches over past 2 years, at least twice a week. 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 She also complains of worsening anxiety, insomnia, fatigue, nighttime snoring, daytime sleepiness, today's ESS score is 18, FSS score is 71 UPDATE Nov 18th 2015: She had flu a few weeks ago, had frequent migraine headaches at that time, has used up 9 tablets of her monthly supply of Maxalt,also her Percocet,   Now her headache has much improved, she is taking Topamax 100 mg  twice a day, tolerating it well, had weight loss. UPDATE Feb 18th 2016: Her headache overall has much improved, she is happy with current management, she is taking Topamax 100 mg twice a day, gabapentin 600 mg 4 tablets a day, Cymbalta 60 mg daily, she has 2 -3 bad headache each months, severe pounding left-sided retro-orbital area headaches, movement made it worse, she has light noise sensitivity, nauseous, movement made it worse. She takes Imitrex, [place] as needed for headache abortion, Percocet occasionally as rescue therapy UPDATE June 9th 2016:She is overall happy with her current migraine treatment, taking Topamax twice a day, also on polypharmacy for her depression anxiety, Cymbalta 60 mg daily, gabapentin 600 mg 4 times a day,She used up 10 tablets of Maxalt each month, also use cambian be as needed, Percocet for rescue treatment She had to severe bad migraines, June 26 2014, Aug 01 2014, lasting for few days She also had sleep study in March, using CPAP machine, which has help her sleep quality, UPDATE 12/28/2014 Ms.Wenzl, 39 year old female returns for follow-up. She has had several weeks of constant headache over the weekend she was in the bed both days. Weather change is a trigger for her. She claims this is the worst it has been a while. She complains of waking up with headaches. She has a history of obstructive sleep apnea but has not followed up with Dr. Vickey Huger. She was encouraged to do so. In addition she takes Topamax 100 mg twice daily, Maxalt acutely. Sumatriptan does not really work for her and she has not been taking this. For really bad headaches she  takes Percocet. She returns for reevaluation   REVIEW OF SYSTEMS: Full 14 system review of systems performed and notable only for those listed, all others are neg:  Constitutional: neg  Cardiovascular: neg Ear/Nose/Throat: neg  Skin: neg Eyes: neg Respiratory: neg Gastroitestinal: neg  Hematology/Lymphatic: neg  Endocrine:  neg Musculoskeletal:neg Allergy/Immunology: neg Neurological: neg Psychiatric: neg Sleep : neg   ALLERGIES: No Known Allergies  HOME MEDICATIONS: Outpatient Prescriptions Prior to Visit  Medication Sig Dispense Refill  . Diclofenac Potassium 50 MG PACK Take 50 mg by mouth as needed. 15 each 11  . DULoxetine (CYMBALTA) 60 MG capsule Take 1 capsule (60 mg total) by mouth daily. 30 capsule 2  . gabapentin (NEURONTIN) 600 MG tablet Take 1 pill in the morning, 1 pill midday, and 2 pills at night. 120 tablet 11  . LO LOESTRIN FE 1 MG-10 MCG / 10 MCG tablet Take 1 tablet by mouth daily.    Marland Kitchen MAGNESIUM ASPARTATE PO Take by mouth daily.    . Multiple Vitamins-Minerals (MULTIVITAMIN PO) Take by mouth daily.    Marland Kitchen oxyCODONE-acetaminophen (PERCOCET) 5-325 MG per tablet Take 1 tablet by mouth every 12 (twelve) hours as needed for severe pain. 30 tablet 0  . rizatriptan (MAXALT-MLT) 10 MG disintegrating tablet Take 1 tablet (10 mg total) by mouth as needed. May repeat in 2 hours if needed 10 tablet 6  . Thiamine HCl (VITAMIN B-1 PO) Take by mouth daily.    Marland Kitchen topiramate (TOPAMAX) 100 MG tablet Take 1 tablet (100 mg total) by mouth 2 (two) times daily. 60 tablet 11  . traMADol (ULTRAM) 50 MG tablet Take 1 tablet (50 mg total) by mouth every 6 (six) hours as needed. 120 tablet 5   No facility-administered medications prior to visit.    PAST MEDICAL HISTORY: Past Medical History  Diagnosis Date  . Headache(784.0)   . Neuropathy (HCC)   . Anxiety   . Low back pain     PAST SURGICAL HISTORY: Past Surgical History  Procedure Laterality Date  . Cesarean section      FAMILY HISTORY: Family History  Problem Relation Age of Onset  . Migraines Mother   . Depression Mother   . Migraines Maternal Grandfather   . Depression Father     SOCIAL HISTORY: Social History   Social History  . Marital Status: Married    Spouse Name: [name]  . Number of Children: 2  . Years of Education: college    Occupational History  .     Social History Main Topics  . Smoking status: Never Smoker   . Smokeless tobacco: Never Used  . Alcohol Use: No  . Drug Use: No  . Sexual Activity: Yes    Birth Control/ Protection: Pill   Other Topics Concern  . 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   Right handed.   Caffeine two cup of coffee daily one glass of sweet tea with dinner.     PHYSICAL EXAM  Filed Vitals:   12/28/14 1127  BP: 115/77  Pulse: 99  Height:  (1.702 m)  Weight: 188 lb 9.6 oz (85.548 kg)   Body mass index is 29.53 kg/(m^2).  Generalized: Well developed, in no acute distress  Head: normocephalic and atraumatic,. Oropharynx benign  Neck: Supple, no carotid bruits  Cardiac: Regular rate rhythm, no murmur  Musculoskeletal: No deformity   Neurological examination   Mentation: Alert oriented to time, place, history taking. Attention span and concentration appropriate. Recent and remote memory intact.  Follows all commands speech and language fluent.   Cranial nerve II-XII: Fundoscopic exam reveals sharp disc margins.Pupils were equal round reactive to light extraocular movements were full, visual field were full on confrontational test. Facial sensation and strength were normal. hearing was intact to finger rubbing bilaterally. Uvula tongue midline. head turning and shoulder shrug were normal and symmetric.Tongue protrusion into cheek strength was normal. Motor: normal bulk and tone, full strength in the BUE, BLE, fine finger movements normal, no pronator drift. No focal weakness Sensory: normal and symmetric to light touch, pinprick, and  Vibration, proprioception  Coordination: finger-nose-finger, heel-to-shin bilaterally, no dysmetria Reflexes: Brachioradialis 2/2, biceps 2/2, triceps 2/2, patellar 2/2, Achilles 2/2, plantar responses were flexor  bilaterally. Gait and Station: Rising up from seated position without assistance, normal stance,  moderate stride, good arm swing, smooth turning, able to perform tiptoe, and heel walking without difficulty. Tandem gait is steady  DIAGNOSTIC DATA (LABS, IMAGING, TESTING)   ASSESSMENT AND PLAN  39 y.o. year old female  With history of frequent migraine headaches, also complicated by depression, anxiety, insomnia,obstructive sleep apnea, normal neurological examinations, she responded well to Topamax as migraine prevention, also taking magnesium oxide, riboflavin, overall her headache has much improved except for the last few weeks almost constant and she feels they are triggered by weather.  Continue Topamax at current dose , will refill Continue Maxalt prn  Try Onzetra nasal spray as directed demonstration on use given to patient with return demonstration Co pay card given Percocet as rescue to continue RX given Follow-up in 6 months Need to make follow-up appointment with Dr. Vickey Hugerohmeier to follow up for obstructive sleep apnea Nilda RiggsNancy Carolyn Martin, [facility] Surgery CenterGNP, [facility] CenterBC, APRN  [facility] [address] [place], [place] [id number] [phone]

## 2015-01-01 NOTE — Progress Notes (Signed)
I have reviewed and agreed above plan. 

## 2015-01-22 ENCOUNTER — Telehealth: Payer: Self-pay | Admitting: Neurology

## 2015-01-22 MED ORDER — SUMATRIPTAN SUCCINATE 11 MG/NOSEPC NA EXHP: 11.0000 mg | INHALANT_POWDER | NASAL | Status: DC | PRN

## 2015-01-22 NOTE — Telephone Encounter (Signed)
Pt says she received samples of SUMAtriptan Succinate (ONZETRA XSAIL) 11 MG/NOSEPC EXHP and was told that if she felt it was working to ask for a rx. She says it is working very well and would like a Rx.thank you

## 2015-01-22 NOTE — Telephone Encounter (Signed)
Rx has been sent  

## 2015-01-29 ENCOUNTER — Ambulatory Visit (HOSPITAL_COMMUNITY): Payer: Self-pay | Admitting: Psychiatry

## 2015-02-01 ENCOUNTER — Other Ambulatory Visit (HOSPITAL_COMMUNITY): Payer: Self-pay | Admitting: Psychiatry

## 2015-02-06 ENCOUNTER — Other Ambulatory Visit (HOSPITAL_COMMUNITY): Payer: Self-pay | Admitting: Psychiatry

## 2015-02-08 ENCOUNTER — Ambulatory Visit: Payer: BC Managed Care – PPO | Admitting: Nurse Practitioner

## 2015-03-01 ENCOUNTER — Telehealth: Payer: Self-pay | Admitting: Neurology

## 2015-03-01 NOTE — Telephone Encounter (Signed)
Pt called to schedule sleep consult with Dr Vickey Hugerohmeier. Dr Terrace ArabiaYan stated in OV 08/10/14 that she would refer pt to Dr Vickey Hugerohmeier but a referral has not been generated. Please call pt when referral is complete [phone]

## 2015-03-06 NOTE — Telephone Encounter (Signed)
Spoke to Dr. Vickey Hugerohmeier. She is agreeable to seeing pt since there is a sleep study in already.

## 2015-03-06 NOTE — Telephone Encounter (Signed)
Called pt to schedule her for an appt with Dr. Vickey Hugerohmeier. No answer, left a message asking her to call me back.

## 2015-03-07 ENCOUNTER — Encounter: Payer: Self-pay | Admitting: Nurse Practitioner

## 2015-03-07 ENCOUNTER — Ambulatory Visit (INDEPENDENT_AMBULATORY_CARE_PROVIDER_SITE_OTHER): Payer: BC Managed Care – PPO | Admitting: Nurse Practitioner

## 2015-03-07 VITALS — BP 111/77 | HR 94 | Ht 67.0 in | Wt 188.8 lb

## 2015-03-07 DIAGNOSIS — G894 Chronic pain syndrome: Secondary | ICD-10-CM | POA: Diagnosis not present

## 2015-03-07 DIAGNOSIS — F321 Major depressive disorder, single episode, moderate: Secondary | ICD-10-CM

## 2015-03-07 DIAGNOSIS — G43009 Migraine without aura, not intractable, without status migrainosus: Secondary | ICD-10-CM

## 2015-03-07 MED ORDER — DICLOFENAC POTASSIUM(MIGRAINE) 50 MG PO PACK: 50.0000 mg | PACK | ORAL | Status: DC | PRN

## 2015-03-07 MED ORDER — TOPIRAMATE 100 MG PO TABS
100.0000 mg | ORAL_TABLET | Freq: Two times a day (BID) | ORAL | Status: DC
Start: 2015-03-07 — End: 2017-05-25

## 2015-03-07 MED ORDER — SUMATRIPTAN SUCCINATE 11 MG/NOSEPC NA EXHP: 11.0000 mg | INHALANT_POWDER | NASAL | Status: DC | PRN

## 2015-03-07 MED ORDER — OXYCODONE-ACETAMINOPHEN 5-325 MG PO TABS: 1.0000 | ORAL_TABLET | Freq: Two times a day (BID) | ORAL | Status: DC | PRN

## 2015-03-07 NOTE — Progress Notes (Signed)
I have reviewed and agreed above plan. 

## 2015-03-07 NOTE — Patient Instructions (Signed)
Continue Topamax at current dose , will refill Continue Cambia will refill Continue Maxalt prn  Continue Onzetra nasal spray as directed will refill Percocet as rescue to continue RX given a Follow-up in 6 months

## 2015-03-07 NOTE — Progress Notes (Signed)
[facility]  PATIENT: Sandra Rocha DOB: 02/02/1976   REASON FOR VISIT follow-up for  migraine headache  HISTORY FROM: patient     HISTORY  OF PRESENT ILLNESS: HISTORYCynthia D Peggye Rocha is a 40 years old right-handed Caucasian female, referred by her psychologist Dr. Grant FontanaArfreed and primary care physician Dr. Elder Cyphersonna Gate for evaluation of chronic migraine headaches She had past medical history of chronic low back pain, has been taking chronic tramadol 50 mg 4 times a day, Neurontin 600 mg 4 tablets each day for low back pain, also history of depression anxiety, taking Cymbalta 60 mg daily She reported a history of chronic migraine over the past 20 years, her headache started since high school, seems to be triggered by stress, sleep deprivation, alcohol intake, certain food, such as chocolate, hormone change, around her menstruation period of time, She used to have headaches occasionally, but over the past 3 years, she has been under a lot of stress, family, and job related, reported increased headaches over past 2 years, at least twice a week. 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 She also complains of worsening anxiety, insomnia, fatigue, nighttime snoring, daytime sleepiness, today's ESS score is 18, FSS score is 4041 UPDATE Nov 18th 2015: She had flu a few weeks ago, had frequent migraine headaches at that time, has used up 9 tablets of her monthly supply of Maxalt,also her Percocet,   Now her headache has much improved, she is taking Topamax 100 mg twice a  day, tolerating it well, had weight loss. UPDATE Feb 18th 2016: Her headache overall has much improved, she is happy with current management, she is taking Topamax 100 mg twice a day, gabapentin 600 mg 4 tablets a day, Cymbalta 60 mg daily, she has 2 -3 bad headache each months, severe pounding left-sided retro-orbital area headaches, movement made it worse, she has light noise sensitivity, nauseous, movement made it worse. She takes Imitrex, [place] as needed for headache abortion, Percocet occasionally as rescue therapy UPDATE June 9th 2016:She is overall happy with her current migraine treatment, taking Topamax twice a day, also on polypharmacy for her depression anxiety, Cymbalta 60 mg daily, gabapentin 600 mg 4 times a day,She used up 10 tablets of Maxalt each month, also use cambian be as needed, Percocet for rescue treatment She had to severe bad migraines, June 26 2014, Aug 01 2014, lasting for few days She also had sleep study in March, using CPAP machine, which has help her sleep quality, UPDATE 12/28/2014 Sandra Rocha, 40 year old female returns for follow-up. She has had several weeks of constant headache over the weekend she was in the bed both days. Weather change is a trigger for her. She claims this is the worst it has been a while. She complains of waking up with headaches. She has a history of obstructive sleep apnea but has not followed up with Dr. Vickey Hugerohmeier. She was encouraged to do so. In addition she takes Topamax 100 mg twice daily, Maxalt acutely. Sumatriptan does not really work for her and she has not been taking this. For really bad  headaches she takes Percocet. She returns for reevaluation UPDATE 03/07/15 Sandra Rocha, 40 year old female returns for follow-up. She continues to have some headaches particularly with weather changes. She tried the sumatriptan  after her last visit and it works well for her if she takes the medication at onset of migraine. She also has diclofenac to take when  necessary. She is on polypharmacy for depression anxiety. Father had heart attack in 2023-02-23 and her dog died on Christmas day. She continues to take Topamax and for really bad headache she uses Percocet. She returns for reevaluation   REVIEW OF SYSTEMS: Full 14 system review of systems performed and notable only for those listed, all others are neg:  Constitutional: neg  Cardiovascular: neg Ear/Nose/Throat: neg  Skin: neg Eyes: neg Respiratory: neg Gastroitestinal: neg  Hematology/Lymphatic: neg  Endocrine: neg Musculoskeletal:neg Allergy/Immunology: neg Neurological migraines Psychiatric: neg Sleep : neg   ALLERGIES: No Known Allergies  HOME MEDICATIONS: Outpatient Prescriptions Prior to Visit  Medication Sig Dispense Refill  . Diclofenac Potassium 50 MG PACK Take 50 mg by mouth as needed. 15 each 11  . DULoxetine (CYMBALTA) 60 MG capsule Take 1 capsule (60 mg total) by mouth daily. 30 capsule 2  . gabapentin (NEURONTIN) 600 MG tablet Take 1 pill in the morning, 1 pill midday, and 2 pills at night. 120 tablet 11  . LO LOESTRIN FE 1 MG-10 MCG / 10 MCG tablet Take 1 tablet by mouth daily.    Marland Kitchen MAGNESIUM ASPARTATE PO Take by mouth daily.    . meloxicam (MOBIC) 7.5 MG tablet     . Multiple Vitamins-Minerals (MULTIVITAMIN PO) Take by mouth daily.    Marland Kitchen oxyCODONE-acetaminophen (PERCOCET) 5-325 MG tablet Take 1 tablet by mouth every 12 (twelve) hours as needed for severe pain. 30 tablet 0  . rizatriptan (MAXALT-MLT) 10 MG disintegrating tablet Take 1 tablet (10 mg total) by mouth as needed. May repeat in 2 hours if needed 10 tablet 6  . SUMAtriptan Succinate (ONZETRA XSAIL) 11 MG/NOSEPC EXHP Place 11 mg into the nose as needed (for migarine. May repeat once after 2 hours if needed). 4 each 3  . Thiamine HCl (VITAMIN B-1 PO) Take by mouth daily.    Marland Kitchen topiramate (TOPAMAX) 100 MG tablet Take 1 tablet (100 mg total) by mouth 2 (two) times daily. 60 tablet 11  . traMADol (ULTRAM) 50 MG  tablet Take 1 tablet (50 mg total) by mouth every 6 (six) hours as needed. 120 tablet 5   No facility-administered medications prior to visit.    PAST MEDICAL HISTORY: Past Medical History  Diagnosis Date  . Headache(784.0)   . Neuropathy (HCC)   . Anxiety   . Low back pain     PAST SURGICAL HISTORY: Past Surgical History  Procedure Laterality Date  . Cesarean section      FAMILY HISTORY: Family History  Problem Relation Age of Onset  . Migraines Mother   . Depression Mother   . Migraines Maternal Grandfather   . Depression Father     SOCIAL HISTORY: Social History   Social History  . Marital Status: Married    Spouse Name: [name]  . Number of Children: 2  . Years of Education: college   Occupational History  .     Social History Main Topics  . Smoking status: Never Smoker   . Smokeless tobacco: Never Used  . Alcohol Use: No  . Drug Use: No  . Sexual Activity: Yes    Birth Control/ Protection:  Pill   Other Topics Concern  . 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   Right handed.   Caffeine two cup of coffee daily one glass of sweet tea with dinner.     PHYSICAL EXAM  Filed Vitals:   03/07/15 1017  BP: 111/77  Pulse: 94  Height: 5\' 7"  (1.702 m)  Weight: 188 lb 12.8 oz (85.639 kg)   Body mass index is 29.56 kg/(m^2). Generalized: Well developed, obese female in no acute distress  Head: normocephalic and atraumatic,. Oropharynx benign  Neck: Supple, no carotid bruits  Cardiac: Regular rate rhythm, no murmur  Musculoskeletal: No deformity   Neurological examination   Mentation: Alert oriented to time, place, history taking. Attention span and concentration appropriate. Recent and remote memory intact. Follows all commands speech and language fluent.   Cranial nerve II-XII: .Pupils were equal round reactive to light extraocular  movements were full, visual field were full on confrontational test. Facial sensation and strength were normal. hearing was intact to finger rubbing bilaterally. Uvula tongue midline. head turning and shoulder shrug were normal and symmetric.Tongue protrusion into cheek strength was normal. Motor: normal bulk and tone, full strength in the BUE, BLE, fine finger movements normal, no pronator drift. No focal weakness Sensory: normal and symmetric to light touch, pinprick, and Vibration,   Coordination: finger-nose-finger, heel-to-shin bilaterally, no dysmetria Reflexes: Brachioradialis 2/2, biceps 2/2, triceps 2/2, patellar 2/2, Achilles 2/2, plantar responses were flexor bilaterally. Gait and Station: Rising up from seated position without assistance, normal stance, moderate stride, good arm swing, smooth turning, able to perform tiptoe, and heel walking without difficulty. Tandem gait is steady   DIAGNOSTIC DATA (LABS, IMAGING, TESTING) -  ASSESSMENT AND PLAN  40 y.o. year old female With history of  migraine headaches, also complicated by depression, anxiety, insomnia,obstructive sleep apnea, normal neurological examinations, she responded well to Topamax as migraine prevention, also taking magnesium oxide, riboflavin, overall her headache has much improved. Triggered by weather.  Continue Topamax at current dose , will refill Continue Maxalt prn  Continue Cambia  will refill  Continue Onzetra nasal spray as directed will refill Percocet as rescue to continue RX given Follow-up in 6 months Need to make follow-up appointment with Dr. Vickey Huger to follow up for obstructive sleep apnea Nilda Riggs, [facility], [facility], APRN  [facility], [facility] [address], [address] [place], [place] [address] [phone]

## 2015-03-08 NOTE — Telephone Encounter (Signed)
Called pt again to schedule her an appt with Dr. Vickey Hugerohmeier. No answer, left a message asking her to call me back.

## 2015-03-13 NOTE — Telephone Encounter (Signed)
Spoke to pt. She is agreeable to coming in on 03/22/15 at 8:30 to discuss sleep study and cpap use. Pt verbalized understanding.

## 2015-03-14 ENCOUNTER — Ambulatory Visit: Payer: BC Managed Care – PPO | Admitting: Nurse Practitioner

## 2015-03-20 ENCOUNTER — Encounter (HOSPITAL_COMMUNITY): Payer: Self-pay | Admitting: Psychiatry

## 2015-03-20 ENCOUNTER — Ambulatory Visit (INDEPENDENT_AMBULATORY_CARE_PROVIDER_SITE_OTHER): Payer: BC Managed Care – PPO | Admitting: Psychiatry

## 2015-03-20 VITALS — BP 133/88 | HR 90 | Ht 63.0 in | Wt 199.6 lb

## 2015-03-20 DIAGNOSIS — F321 Major depressive disorder, single episode, moderate: Secondary | ICD-10-CM

## 2015-03-20 DIAGNOSIS — F1010 Alcohol abuse, uncomplicated: Secondary | ICD-10-CM

## 2015-03-20 MED ORDER — DULOXETINE HCL 60 MG PO CPEP: 60.0000 mg | ORAL_CAPSULE | Freq: Every day | ORAL | Status: DC

## 2015-03-20 NOTE — Progress Notes (Signed)
[facility] [id number] Progress Note   Sandra Rocha [id number] 40 y.o.  03/20/2015 4:52 PM  Chief Complaint:  Medication management and follow-up.     History of Present Illness:  Sandra Rocha came for her followup appointment.   She missed appointment in November because her father sick.  Her father had a heart attack and require immediate stent placement.  However she was relieved that he is doing better.  She saw a neurologist for her headaches and now she is taking CPAP for sleep apnea.  She has seen improvement.  She is taking Cymbalta which is helping her depression and anxiety.  She still have times when she is feeling tired and lack of energy but overall she described her mood more stable and less depressed.  She lives with her husband.  She endorse job is going very well.  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  Patient admitted some time job is stressful but she is handling better.  She denies any feeling of hopelessness or worthlessness.  She denies any paranoia, hallucination, suicidal thoughts or any aggressive behavior.  Her energy level is fair.  Her appetite is okay.  Her vitals are stable.  Suicidal Ideation: No Plan Formed: No Patient has means to carry out plan: No  Homicidal Ideation: No Plan Formed: No Patient has means to carry out plan: No  Past Psychiatric History/Hospitalization(s) Patient diagnosed with postpartum depression and at that time she saw a therapist when she was given Paxil with good response.  She stopped the medication before her second pregnancy and after her son born she resumed with Lexapro.  She was taking Lexapro until July of 2015 she stopped .  During recent months her neurologist has prescribed Wellbutrin, Seroquel, Vistaril for the treatment of migraine headaches.  Patient denies any history of suicidal attempt, inpatient treatment, psychosis, mania, hallucination , anger or violence.  She is taking  Xanax which is prescribed by her OB/GYN. Anxiety: Yes Bipolar Disorder: No Depression: Yes Mania: No Psychosis: No Schizophrenia: No Personality Disorder: No Hospitalization for psychiatric illness: Patient stayed overnight in the emergency room in August 2015 because of severe depression History of Electroconvulsive Shock Therapy: No Prior Suicide Attempts: No  Medical History; Patient has multiple health issues.  She has chronic back pain due to 2 bulging disc, migraine headaches and neuropathy.  Patient denies any seizures.  Review of Systems  Constitutional: Positive for malaise/fatigue.       Feeling tired  Cardiovascular: Negative for chest pain and palpitations.  Skin: Negative for itching and rash.  Psychiatric/Behavioral: Negative for depression, suicidal ideas, hallucinations and substance abuse. The patient is not nervous/anxious.     Psychiatric: Agitation: No Hallucination: No Depressed Mood: No Insomnia: No Hypersomnia: No Altered Concentration: No Feels Worthless: No Grandiose Ideas: No Belief In Special Powers: No New/Increased Substance Abuse: No Compulsions: No  Neurologic: Headache: Yes Seizure: No Paresthesias: Yes   Musculoskeletal: Strength & Muscle Tone: within normal limits Gait & Station: normal Patient leans: N/A   Outpatient Encounter Prescriptions as of 03/20/2015  Medication Sig  . Diclofenac Potassium 50 MG PACK Take 50 mg by mouth as needed.  . DULoxetine (CYMBALTA) 60 MG capsule Take 1 capsule (60 mg total) by mouth daily.  Marland Kitchen gabapentin (NEURONTIN) 600 MG tablet Take 1 pill in the morning, 1 pill midday, and 2 pills at night.  . LO LOESTRIN FE 1 MG-10 MCG / 10 MCG tablet Take  1 tablet by mouth daily.  Marland Kitchen MAGNESIUM ASPARTATE PO Take by mouth daily.  . meloxicam (MOBIC) 7.5 MG tablet   . Multiple Vitamins-Minerals (MULTIVITAMIN PO) Take by mouth daily.  Marland Kitchen oxyCODONE-acetaminophen (PERCOCET) 5-325 MG tablet Take 1 tablet by mouth  every 12 (twelve) hours as needed for severe pain.  . rizatriptan (MAXALT-MLT) 10 MG disintegrating tablet Take 1 tablet (10 mg total) by mouth as needed. May repeat in 2 hours if needed  . SUMAtriptan Succinate (ONZETRA XSAIL) 11 MG/NOSEPC EXHP Place 11 mg into the nose as needed (for migarine. May repeat once after 2 hours if needed).  . Thiamine HCl (VITAMIN B-1 PO) Take by mouth daily.  Marland Kitchen topiramate (TOPAMAX) 100 MG tablet Take 1 tablet (100 mg total) by mouth 2 (two) times daily.  . traMADol (ULTRAM) 50 MG tablet Take 1 tablet (50 mg total) by mouth every 6 (six) hours as needed.  . valACYclovir (VALTREX) 500 MG tablet   . [DISCONTINUED] DULoxetine (CYMBALTA) 60 MG capsule Take 1 capsule (60 mg total) by mouth daily.   No facility-administered encounter medications on file as of 03/20/2015.    No results found for this or any previous visit (from the past 2160 hour(s)).    Constitutional:  BP 133/88 mmHg  Pulse 90  Ht  (1.6 m)  Wt 199 lb 9.6 oz (90.538 kg)  BMI 35.37 kg/m2  LMP 02/22/2015   Mental Status Examination;  Patient is well groomed well dressed female who appears to be in her stated age.   She is cooperative and maintained good eye contact. Her speech is slow, clear, fluent and coherent.  Her thought process logical and goal-directed.  She described her mood euthymic and her affect is appropriate. There were no delusions, paranoia or any obsessive thoughts.  Her psychomotor activity is normal.  Her fund of knowledge is good.  Patient denies any auditory or visual hallucination.  She denies any active or passive suicidal thoughts or homicidal thoughts.  She is alert and oriented x3.  There were no flight of ideas or any loose association.  Her insight judgment and impulse control is okay.   Established Problem, Stable/Improving (1), Review of Psycho-Social Stressors (1), Review of Last Therapy Session (1) and Review of Medication Regimen & Side Effects  (2)  Assessment: Axis I: Depressive disorder, recurrent.  Alcohol abuse disorder  Axis II: Deferred  Axis III:  Past Medical History  Diagnosis Date  . Headache(784.0)   . Neuropathy (HCC)   . Anxiety   . Low back pain     Plan:  Patient is doing better on current dose of Cymbalta.  She has no tremors, shakes or any side effects.  She is seeing a neurologist for her headaches and also using CPAP machine.  Discussed medication side effects and benefits. She is getting Topamax, Imitrex, Maxalt, Neurontin and when necessary Percocet for other providers.  Discussed polypharmacy.  Recommended to call us back if she feels worsening of the symptom.  Follow-up in 3 months.    Theresea Trautmann T., MD 03/20/2015

## 2015-03-22 ENCOUNTER — Telehealth: Payer: Self-pay

## 2015-03-22 ENCOUNTER — Ambulatory Visit: Payer: Self-pay | Admitting: Neurology

## 2015-03-22 NOTE — Telephone Encounter (Signed)
Pt did not show for their appt with Dr. Dohmeier today.  

## 2015-03-23 ENCOUNTER — Encounter: Payer: Self-pay | Admitting: Neurology

## 2015-03-26 ENCOUNTER — Telehealth: Payer: Self-pay

## 2015-03-26 ENCOUNTER — Ambulatory Visit: Payer: BC Managed Care – PPO | Admitting: Neurology

## 2015-03-26 NOTE — Telephone Encounter (Signed)
Pt's no shows:  03/26/15-cancelled same day appointment for a sick child  03/22/15- cancelled same day appointment for a sick child  11/08/14- no show  09/21/2014- no show  This is 4 no shows within the past year. Per [facility] policy, pt meets criteria to dismiss. Ok to dismiss?

## 2015-03-26 NOTE — Telephone Encounter (Signed)
Dismiss, please 

## 2015-03-26 NOTE — Telephone Encounter (Signed)
Pt did not show for their appt with Dr. Dohmeier today.  

## 2015-03-27 ENCOUNTER — Encounter: Payer: Self-pay | Admitting: Neurology

## 2015-04-17 ENCOUNTER — Ambulatory Visit: Payer: BC Managed Care – PPO | Admitting: Sports Medicine

## 2015-04-18 ENCOUNTER — Other Ambulatory Visit (HOSPITAL_COMMUNITY)
Admission: RE | Admit: 2015-04-18 | Discharge: 2015-04-18 | Disposition: A | Discharge status: Home / Self Care | Payer: BC Managed Care – PPO | Source: Ambulatory Visit | Attending: Family Medicine | Admitting: Family Medicine

## 2015-04-18 ENCOUNTER — Other Ambulatory Visit: Payer: Self-pay | Admitting: Family Medicine

## 2015-04-18 DIAGNOSIS — Z01411 Encounter for gynecological examination (general) (routine) with abnormal findings: Secondary | ICD-10-CM | POA: Diagnosis not present

## 2015-04-20 LAB — CYTOLOGY - PAP

## 2015-04-23 ENCOUNTER — Other Ambulatory Visit: Payer: Self-pay | Admitting: *Deleted

## 2015-04-23 MED ORDER — TRAMADOL HCL 50 MG PO TABS: 50.0000 mg | ORAL_TABLET | Freq: Four times a day (QID) | ORAL | Status: DC | PRN

## 2015-05-01 ENCOUNTER — Ambulatory Visit (INDEPENDENT_AMBULATORY_CARE_PROVIDER_SITE_OTHER): Payer: BC Managed Care – PPO | Admitting: Sports Medicine

## 2015-05-01 ENCOUNTER — Telehealth: Payer: Self-pay | Admitting: Neurology

## 2015-05-01 ENCOUNTER — Encounter: Payer: Self-pay | Admitting: Sports Medicine

## 2015-05-01 VITALS — BP 116/78 | HR 91 | Ht 67.0 in | Wt 185.0 lb

## 2015-05-01 DIAGNOSIS — G894 Chronic pain syndrome: Secondary | ICD-10-CM

## 2015-05-01 DIAGNOSIS — M545 Low back pain: Secondary | ICD-10-CM

## 2015-05-01 DIAGNOSIS — M79605 Pain in left leg: Principal | ICD-10-CM

## 2015-05-01 MED ORDER — OXYCODONE-ACETAMINOPHEN 5-325 MG PO TABS: 1.0000 | ORAL_TABLET | Freq: Two times a day (BID) | ORAL | Status: DC | PRN

## 2015-05-01 MED ORDER — RIZATRIPTAN BENZOATE 10 MG PO TBDP: 10.0000 mg | ORAL_TABLET | ORAL | Status: AC | PRN

## 2015-05-01 NOTE — Progress Notes (Signed)
Patient ID: Sandra Rocha, female   DOB: 04/30/1975, 40 y.o.   MRN: [id number]  CC:  Low Back Pain  This is long standing. Actually some better over last 6 mos. Saw NS (Dr Channing Mutters) - new MRI shows DDD L5/S1 Still favors conservative care  Takes 4 tramadol and 4 gabapentin daily Taking Meloxicam 7.5 bid  Cymbalta for anxiety also helps back  Now down to 2 bad flares per year  Pattern now is LBP now down to 4 days per week If she is on usual pattern 3/10 pain On days she flares 7/10 Radiation to Rt buttocks and to Rt knee  Past HX Anxiety dates to 1st pregnancy Sleep apnea - DX 1 year ago  Fam Hx 81 y/o daughter/ 53 yo son Works Building services engineer at [employer]   ROS Left big toe numbness Sometimes restless leg at night Insomnia if not on CPAP Weakness on left leg at times - sitting triggers this Sometimes feels bowel control not as good/ no accidents   PEXAM  Pleasant, NAD BP 116/78 mmHg  Pulse 91  Ht  (1.702 m)  Wt 185 lb (83.915 kg)  BMI 28.97 kg/m2  Able to walk heel and toe and tandem Knee and ankle reflexes norm SLR to 60 Deg bilat Weak on left foot to inversion and eversion SLT weak grt toe extension No diff in strength above ankles Flexion good Lat bending good Extension is tight

## 2015-05-01 NOTE — Telephone Encounter (Signed)
Patient is calling and states she has questions about some of her medications she is taking before she leaves our practice.  Please call.

## 2015-05-01 NOTE — Patient Instructions (Signed)
Your back is stable but you do still have weakness on left  Core strength is helpful - get this at least twice weekly Side planks Forward bridge Lateral leg lifts Situps/ make them comfortable Pelvic tilt  On two days per week 1 foot stand/ 1 foot heel raises Balance with eyes closed Easy squats Do walking lunges  Then emphasize walking  Medications - no reason we have to change schedule Vary dose of tramadol if not much pain  See me every 6 months

## 2015-05-01 NOTE — Assessment & Plan Note (Signed)
She feels that she has improved a lot Now at more consistent pattern of pain 4 days per week vs 7 Average level gets to 3/10 and rarely up to 7 to 8/10  Cont meds as before Flexion exercises Monitor q 6 mos unless she gets acute flare

## 2015-05-01 NOTE — Telephone Encounter (Signed)
Patient has been discharged for no shows.  States she has a pending appt with a new neurologist but needs refills on her Maxalt and oxycodone.  Ok per Dr. Terrace Arabia to grant Maxalt with 6 refills and one 30-day supply of oxycodone.  Patient aware.

## 2015-06-11 ENCOUNTER — Other Ambulatory Visit (HOSPITAL_COMMUNITY): Payer: Self-pay | Admitting: Psychiatry

## 2015-06-11 ENCOUNTER — Other Ambulatory Visit: Payer: Self-pay | Admitting: *Deleted

## 2015-06-11 MED ORDER — TRAMADOL HCL 50 MG PO TABS: 50.0000 mg | ORAL_TABLET | Freq: Four times a day (QID) | ORAL | Status: DC | PRN

## 2015-06-18 ENCOUNTER — Ambulatory Visit (HOSPITAL_COMMUNITY): Payer: Self-pay | Admitting: Psychiatry

## 2015-06-26 ENCOUNTER — Ambulatory Visit: Payer: BC Managed Care – PPO | Admitting: Family Medicine

## 2015-06-27 ENCOUNTER — Ambulatory Visit (INDEPENDENT_AMBULATORY_CARE_PROVIDER_SITE_OTHER): Payer: BC Managed Care – PPO | Admitting: Family Medicine

## 2015-06-27 ENCOUNTER — Ambulatory Visit (HOSPITAL_BASED_OUTPATIENT_CLINIC_OR_DEPARTMENT_OTHER)
Admission: RE | Admit: 2015-06-27 | Discharge: 2015-06-27 | Disposition: A | Discharge status: Home / Self Care | Payer: BC Managed Care – PPO | Source: Ambulatory Visit | Attending: Family Medicine | Admitting: Family Medicine

## 2015-06-27 ENCOUNTER — Encounter: Payer: Self-pay | Admitting: Family Medicine

## 2015-06-27 VITALS — BP 128/83 | HR 105 | Ht 67.0 in | Wt 180.0 lb

## 2015-06-27 DIAGNOSIS — M25552 Pain in left hip: Secondary | ICD-10-CM | POA: Diagnosis not present

## 2015-06-27 MED ORDER — HYDROCODONE-ACETAMINOPHEN 5-325 MG PO TABS: 1.0000 | ORAL_TABLET | Freq: Four times a day (QID) | ORAL | Status: DC | PRN

## 2015-06-27 NOTE — Patient Instructions (Signed)
I'm concerned you may have a hip, proximal femur stress fracture. Get x-rays downstairs today as you leave. Ice the area 15 minutes at a time 3-4 times a day. Use crutches and try not to put any weight on this leg for now. Continue your meloxicam, gabapentin. Take either tramadol or the norco as needed for severe pain. We will call you with the x-ray results.  If these are normal we will go ahead with an MRI.

## 2015-06-28 DIAGNOSIS — M25552 Pain in left hip: Secondary | ICD-10-CM | POA: Insufficient documentation

## 2015-06-28 NOTE — Progress Notes (Addendum)
PCP: Hollice EspyGATES,DONNA RUTH, MD  Subjective:   HPI: Patient is a 40 y.o. female here for left leg pain.  Patient reports she's had worsening pain in left groin/hip for past 2 weeks. Has been icing. Is a runner - only about 6 miles a week though prior to this. Pain worse with walking and running. Pain level 7-8/10 and sharp. Feels weak. Has history of back pain but this is different. Difficulty lying on left side also. No numbness/tingling. No bowel/bladder dysfunction. No history of stress fracture.  Past Medical History  Diagnosis Date  . Headache(784.0)   . Neuropathy (HCC)   . Anxiety   . Low back pain     Current Outpatient Prescriptions on File Prior to Visit  Medication Sig Dispense Refill  . DULoxetine (CYMBALTA) 60 MG capsule Take 1 capsule (60 mg total) by mouth daily. 30 capsule 2  . gabapentin (NEURONTIN) 600 MG tablet Take 1 pill in the morning, 1 pill midday, and 2 pills at night. 120 tablet 11  . LO LOESTRIN FE 1 MG-10 MCG / 10 MCG tablet Take 1 tablet by mouth daily.    Marland Kitchen. MAGNESIUM ASPARTATE PO Take by mouth daily.    . meloxicam (MOBIC) 7.5 MG tablet     . Multiple Vitamins-Minerals (MULTIVITAMIN PO) Take by mouth daily.    . rizatriptan (MAXALT-MLT) 10 MG disintegrating tablet Take 1 tablet (10 mg total) by mouth as needed. May repeat in 2 hours if needed 10 tablet 6  . SUMAtriptan (IMITREX) 100 MG tablet     . SUMAtriptan Succinate (ONZETRA XSAIL) 11 MG/NOSEPC EXHP Place 11 mg into the nose as needed (for migarine. May repeat once after 2 hours if needed). 4 each 6  . Thiamine HCl (VITAMIN B-1 PO) Take by mouth daily.    Marland Kitchen. topiramate (TOPAMAX) 100 MG tablet Take 1 tablet (100 mg total) by mouth 2 (two) times daily. 60 tablet 11  . traMADol (ULTRAM) 50 MG tablet Take 1 tablet (50 mg total) by mouth every 6 (six) hours as needed. 120 tablet 1  . valACYclovir (VALTREX) 500 MG tablet      No current facility-administered medications on file prior to visit.     Past Surgical History  Procedure Laterality Date  . Cesarean section      No Known Allergies  Social History   Social History  . Marital Status: Married    Spouse Name: [name]  . Number of Children: 2  . Years of Education: college   Occupational History  .     Social History Main Topics  . Smoking status: Never Smoker   . Smokeless tobacco: Never Used  . Alcohol Use: No  . Drug Use: No  . Sexual Activity: Yes    Birth Control/ Protection: Pill   Other Topics Concern  . Not on file   Social History Narrative   Patient lives at home with her husband [name]([name])   Patient  Works full time IT trainerCPA. CFO    Education college.   Right handed.   Caffeine two cup of coffee daily one glass of sweet tea with dinner.    Family History  Problem Relation Age of Onset  . Migraines Mother   . Depression Mother   . Migraines Maternal Grandfather   . Depression Father     BP 128/83 mmHg  Pulse 105  Ht 5\' 7"  (1.702 m)  Wt 180 lb (81.647 kg)  BMI 28.19 kg/m2  LMP 06/14/2015  Review of Systems: See  HPI above.    Objective:  Physical Exam:  Gen: NAD, comfortable in exam room  Back/left hip: No gross deformity, scoliosis. No focal TTP of back, hip including trochanter. FROM. Strength LEs 5/5 all muscle groups.   Negative SLRs. Sensation intact to light touch bilaterally. Positive left hip logroll. Unable to perform hop test - just standing on left leg causes pain in groin area. Negative fabers and piriformis stretches - pain in groin with these but does not reproduce pain.    Assessment & Plan:  1. Left hip pain - in runner though volume of running is low.  Positive testing for proximal femur/hip stress fracture.  Independently reviewed radiographs and no abnormalities to account for her pain.  Will go ahead with MRI to further assess.  Crutches and try not to bear weight on this leg in the meantime.  Meloxicam, gabapentin.  Norco as needed for severe  pain.  Addendum:  MRI reviewed and discussed with patient.  No evidence of stress fracture - she has a probable small labral tear.  This would be my second consideration based on her exam so I believe she does have one of these.  She will start physical therapy, meloxicam.  Consider intraarticular injection if still not improving.  F/u in 4-6 weeks.  No running unless she progresses to this in PT.

## 2015-06-28 NOTE — Assessment & Plan Note (Signed)
in runner though volume of running is low.  Positive testing for proximal femur/hip stress fracture.  Independently reviewed radiographs and no abnormalities to account for her pain.  Will go ahead with MRI to further assess.  Crutches and try not to bear weight on this leg in the meantime.  Meloxicam, gabapentin.  Norco as needed for severe pain.

## 2015-06-29 ENCOUNTER — Telehealth: Payer: Self-pay | Admitting: Family Medicine

## 2015-06-29 NOTE — Addendum Note (Signed)
Addended by: Kathi SimpersWISE, Kenidy Crossland F on: 06/29/2015 01:23 PM   Modules accepted: Orders

## 2015-06-30 ENCOUNTER — Ambulatory Visit (HOSPITAL_BASED_OUTPATIENT_CLINIC_OR_DEPARTMENT_OTHER)
Admission: RE | Admit: 2015-06-30 | Discharge: 2015-06-30 | Disposition: A | Discharge status: Home / Self Care | Payer: BC Managed Care – PPO | Source: Ambulatory Visit | Attending: Family Medicine | Admitting: Family Medicine

## 2015-06-30 DIAGNOSIS — M25552 Pain in left hip: Secondary | ICD-10-CM | POA: Diagnosis present

## 2015-07-02 NOTE — Telephone Encounter (Signed)
Sandra Rocha contacted on Friday regarding these.

## 2015-07-03 ENCOUNTER — Telehealth: Payer: Self-pay | Admitting: Family Medicine

## 2015-07-03 NOTE — Telephone Encounter (Signed)
Spoke with patient and provided results.  See addendum to her office note.

## 2015-07-05 ENCOUNTER — Other Ambulatory Visit: Payer: Self-pay | Admitting: Sports Medicine

## 2015-07-16 ENCOUNTER — Telehealth: Payer: Self-pay | Admitting: Family Medicine

## 2015-07-16 NOTE — Telephone Encounter (Signed)
I think we should see her in the office for this - see if it's different from the prior hip issue.  Sandra Rocha please set this up.  Thanks!

## 2015-07-17 ENCOUNTER — Ambulatory Visit (INDEPENDENT_AMBULATORY_CARE_PROVIDER_SITE_OTHER): Payer: BC Managed Care – PPO | Admitting: Family Medicine

## 2015-07-17 ENCOUNTER — Encounter: Payer: Self-pay | Admitting: Family Medicine

## 2015-07-17 VITALS — BP 127/80 | HR 106 | Ht 67.0 in | Wt 180.0 lb

## 2015-07-17 DIAGNOSIS — M25552 Pain in left hip: Secondary | ICD-10-CM | POA: Diagnosis not present

## 2015-07-17 MED ORDER — HYDROCODONE-ACETAMINOPHEN 5-325 MG PO TABS: 1.0000 | ORAL_TABLET | Freq: Four times a day (QID) | ORAL | Status: DC | PRN

## 2015-07-17 NOTE — Patient Instructions (Signed)
Ice the area 15 minutes at a time 3-4 times a day as needed. Start physical therapy and do home exercises on days you don't go to therapy. Continue your meloxicam, gabapentin. Take either tramadol or the norco as needed for severe pain. Follow up with me in 6 weeks.

## 2015-07-19 NOTE — Progress Notes (Signed)
PCP: Hollice Espy, MD  Subjective:   HPI: Patient is a 40 y.o. female here for left leg pain.  4/26: Patient reports she's had worsening pain in left groin/hip for past 2 weeks. Has been icing. Is a runner - only about 6 miles a week though prior to this. Pain worse with walking and running. Pain level 7-8/10 and sharp. Feels weak. Has history of back pain but this is different. Difficulty lying on left side also. No numbness/tingling. No bowel/bladder dysfunction. No history of stress fracture.  5/16: Patient reports she hasn't started PT yet. Still having pain in left groin to 7/10 level, sharp. Worse with ambulating still, better with rest. Also feels weak. No numbness or tingling. No bowel/bladder dysfunction. No skin changes.  Past Medical History  Diagnosis Date  . Headache(784.0)   . Neuropathy (HCC)   . Anxiety   . Low back pain     Current Outpatient Prescriptions on File Prior to Visit  Medication Sig Dispense Refill  . DULoxetine (CYMBALTA) 60 MG capsule Take 1 capsule (60 mg total) by mouth daily. 30 capsule 2  . gabapentin (NEURONTIN) 600 MG tablet Take 1 pill in the morning, 1 pill midday, and 2 pills at night. 120 tablet 11  . LO LOESTRIN FE 1 MG-10 MCG / 10 MCG tablet Take 1 tablet by mouth daily.    Marland Kitchen MAGNESIUM ASPARTATE PO Take by mouth daily.    . meloxicam (MOBIC) 7.5 MG tablet     . Multiple Vitamins-Minerals (MULTIVITAMIN PO) Take by mouth daily.    . rizatriptan (MAXALT-MLT) 10 MG disintegrating tablet Take 1 tablet (10 mg total) by mouth as needed. May repeat in 2 hours if needed 10 tablet 6  . SUMAtriptan (IMITREX) 100 MG tablet     . SUMAtriptan Succinate (ONZETRA XSAIL) 11 MG/NOSEPC EXHP Place 11 mg into the nose as needed (for migarine. May repeat once after 2 hours if needed). 4 each 6  . Thiamine HCl (VITAMIN B-1 PO) Take by mouth daily.    Marland Kitchen topiramate (TOPAMAX) 100 MG tablet Take 1 tablet (100 mg total) by mouth 2 (two) times daily.  60 tablet 11  . traMADol (ULTRAM) 50 MG tablet TAKE 1 TABLET EVERY 6 HOURS AS NEEDED FOR PAIN. 120 tablet 0  . valACYclovir (VALTREX) 500 MG tablet      No current facility-administered medications on file prior to visit.    Past Surgical History  Procedure Laterality Date  . Cesarean section      No Known Allergies  Social History   Social History  . Marital Status: Married    Spouse Name: [name]  . Number of Children: 2  . Years of Education: college   Occupational History  .     Social History Main Topics  . Smoking status: Never Smoker   . Smokeless tobacco: Never Used  . Alcohol Use: No  . Drug Use: No  . Sexual Activity: Yes    Birth Control/ Protection: Pill   Other Topics Concern  . 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   Right handed.   Caffeine two cup of coffee daily one glass of sweet tea with dinner.    Family History  Problem Relation Age of Onset  . Migraines Mother   . Depression Mother   . Migraines Maternal Grandfather   .  Depression Father     BP 127/80 mmHg  Pulse 106  Ht 5\' 7"  (1.702 m)  Wt 180 lb (81.647 kg)  BMI 28.19 kg/m2  LMP 06/14/2015  Review of Systems: See HPI above.    Objective:  Physical Exam:  Gen: NAD, comfortable in exam room  Back/left hip: No gross deformity, scoliosis. No focal TTP of back, hip including trochanter. FROM. Strength LEs 5/5 all muscle groups.   Negative SLRs. Sensation intact to light touch bilaterally. Positive left hip logroll. Unable to perform hop test - just standing on left leg causes pain in groin area. Negative fabers and piriformis stretches - pain in groin with these but does not reproduce pain.    Assessment & Plan:  1. Left hip pain - MRI showed no stress fracture but probable small labral tear.  This would fit with location of pain and exam.  She is going to start  physical therapy and do home exercises.  Advised to consider intraarticular cortisone injection if she is struggling.  Meloxicam, gabapentin with either norco or tramadol as needed.  F/u in 6 weeks.

## 2015-07-19 NOTE — Assessment & Plan Note (Signed)
MRI showed no stress fracture but probable small labral tear.  This would fit with location of pain and exam.  She is going to start physical therapy and do home exercises.  Advised to consider intraarticular cortisone injection if she is struggling.  Meloxicam, gabapentin with either norco or tramadol as needed.  F/u in 6 weeks.

## 2015-07-23 ENCOUNTER — Ambulatory Visit (HOSPITAL_COMMUNITY): Payer: BC Managed Care – PPO | Admitting: Psychiatry

## 2015-07-26 ENCOUNTER — Ambulatory Visit (HOSPITAL_COMMUNITY): Payer: BC Managed Care – PPO | Admitting: Psychiatry

## 2015-08-14 ENCOUNTER — Telehealth: Payer: Self-pay | Admitting: Family Medicine

## 2015-08-14 NOTE — Telephone Encounter (Signed)
Spoke to patient and resubmitted PT order.

## 2015-08-17 ENCOUNTER — Other Ambulatory Visit: Payer: Self-pay | Admitting: *Deleted

## 2015-08-17 MED ORDER — TRAMADOL HCL 50 MG PO TABS: 50.0000 mg | ORAL_TABLET | Freq: Two times a day (BID) | ORAL | Status: DC | PRN

## 2015-08-28 ENCOUNTER — Ambulatory Visit: Payer: BC Managed Care – PPO | Admitting: Family Medicine

## 2015-09-12 ENCOUNTER — Ambulatory Visit: Payer: BC Managed Care – PPO | Admitting: Nurse Practitioner

## 2015-09-19 ENCOUNTER — Other Ambulatory Visit: Payer: Self-pay | Admitting: Sports Medicine

## 2015-09-20 ENCOUNTER — Other Ambulatory Visit: Payer: Self-pay | Admitting: *Deleted

## 2015-10-11 ENCOUNTER — Other Ambulatory Visit: Payer: Self-pay | Admitting: Sports Medicine

## 2015-10-15 ENCOUNTER — Other Ambulatory Visit: Payer: Self-pay | Admitting: Sports Medicine

## 2015-10-17 ENCOUNTER — Other Ambulatory Visit: Payer: Self-pay | Admitting: *Deleted

## 2015-10-17 MED ORDER — GABAPENTIN 600 MG PO TABS: ORAL_TABLET | ORAL | 11 refills | Status: DC

## 2015-10-17 MED ORDER — TRAMADOL HCL 50 MG PO TABS: 50.0000 mg | ORAL_TABLET | Freq: Four times a day (QID) | ORAL | 0 refills | Status: DC | PRN

## 2015-11-16 ENCOUNTER — Other Ambulatory Visit: Payer: Self-pay | Admitting: *Deleted

## 2015-11-16 ENCOUNTER — Other Ambulatory Visit: Payer: Self-pay | Admitting: Sports Medicine

## 2015-11-16 MED ORDER — TRAMADOL HCL 50 MG PO TABS: 50.0000 mg | ORAL_TABLET | Freq: Four times a day (QID) | ORAL | 0 refills | Status: DC | PRN

## 2015-12-13 ENCOUNTER — Other Ambulatory Visit: Payer: Self-pay | Admitting: Sports Medicine

## 2016-01-14 ENCOUNTER — Other Ambulatory Visit: Payer: Self-pay | Admitting: Sports Medicine

## 2016-02-06 ENCOUNTER — Other Ambulatory Visit: Payer: Self-pay | Admitting: *Deleted

## 2016-02-06 MED ORDER — TRAMADOL HCL 50 MG PO TABS: 50.0000 mg | ORAL_TABLET | Freq: Four times a day (QID) | ORAL | 0 refills | Status: DC | PRN

## 2016-03-11 ENCOUNTER — Ambulatory Visit (INDEPENDENT_AMBULATORY_CARE_PROVIDER_SITE_OTHER): Payer: BC Managed Care – PPO | Admitting: Sports Medicine

## 2016-03-11 ENCOUNTER — Encounter: Payer: Self-pay | Admitting: Sports Medicine

## 2016-03-11 DIAGNOSIS — M5416 Radiculopathy, lumbar region: Secondary | ICD-10-CM

## 2016-03-11 MED ORDER — TRAMADOL HCL 50 MG PO TABS: 50.0000 mg | ORAL_TABLET | Freq: Four times a day (QID) | ORAL | 0 refills | Status: DC | PRN

## 2016-03-11 NOTE — Assessment & Plan Note (Signed)
WE will continue gabapentin and tramadol Neurosurgeon did not feel surgical intervention needed Cont on walking program Wait for hip to improve to run Try sweimming

## 2016-03-11 NOTE — Assessment & Plan Note (Signed)
Will cont on tramadol and Mobic Try to avoid Norco

## 2016-03-11 NOTE — Progress Notes (Signed)
   Low Back Pain Better controlled with 2 to 4 tramadol per day Gabapentin helps Dr Channing Muttersoy added Mobic and that has helped Still gets left sided sciatica Worse if sitting too long  Migraines: now on botox injections that have seemed to help Frequency is 1 to 2 per week Gabap[entin helps some \\Botox  [facility]  Past Hx RT hip labral tear Came after increased running  Soc: [employer]  ROS Has sciatica into left leg 3 to 4 times per week Mild weakness in left foot Balance less standing on left foot RT hip pain  PE Pleasant F in NAD BP 112/76   Ht 5\' 7"  (1.702 m)   Wt 185 lb (83.9 kg)   BMI 28.98 kg/m   LB Motion Normal flexion Mild limitation on extension and some pain Lateral bend and rotation are tight but normal SLR no pain until > 60 deg  Toe, heel , tandem walk OK DTRs normal  Mild weakness in left leg on testing

## 2016-04-11 ENCOUNTER — Other Ambulatory Visit: Payer: Self-pay | Admitting: *Deleted

## 2016-04-11 MED ORDER — TRAMADOL HCL 50 MG PO TABS: 50.0000 mg | ORAL_TABLET | Freq: Four times a day (QID) | ORAL | 0 refills | Status: DC | PRN

## 2016-05-19 ENCOUNTER — Other Ambulatory Visit: Payer: Self-pay | Admitting: *Deleted

## 2016-05-19 MED ORDER — TRAMADOL HCL 50 MG PO TABS: 50.0000 mg | ORAL_TABLET | Freq: Four times a day (QID) | ORAL | 0 refills | Status: DC | PRN

## 2016-09-01 ENCOUNTER — Other Ambulatory Visit: Payer: Self-pay | Admitting: Sports Medicine

## 2016-10-09 ENCOUNTER — Other Ambulatory Visit: Payer: Self-pay | Admitting: Sports Medicine

## 2016-10-16 ENCOUNTER — Ambulatory Visit (INDEPENDENT_AMBULATORY_CARE_PROVIDER_SITE_OTHER): Payer: BC Managed Care – PPO | Admitting: Sports Medicine

## 2016-10-16 DIAGNOSIS — M545 Low back pain: Secondary | ICD-10-CM | POA: Diagnosis not present

## 2016-10-16 DIAGNOSIS — G8929 Other chronic pain: Secondary | ICD-10-CM | POA: Diagnosis not present

## 2016-10-16 MED ORDER — GABAPENTIN 600 MG PO TABS: 600.0000 mg | ORAL_TABLET | Freq: Three times a day (TID) | ORAL | 11 refills | Status: DC

## 2016-10-16 NOTE — Assessment & Plan Note (Signed)
We stopped tramadol w pregnancy and may want to try to shift to tylenol long term  Keep up gabapentin  Walking  Post pregnancy consider a stabilization program and more exercises In immediate post partum do isometric abd and pelvic  Will reck then  Gabapentin renewed

## 2016-10-16 NOTE — Patient Instructions (Signed)
You may have had some benefits for low back with changes of pregnancy  Keep up gabapentin Tylenol may be adequate for pain  After C section - get good wound healing Start easy isometrics for low abdomen and pelvic floor Walking as directed  After 6 weeks we may want to start a specific back exercise program and we can evaluate for that  Massage may be helpful last few weeks

## 2016-10-16 NOTE — Progress Notes (Signed)
CC: Chronic lumbar radiculopathy  Now [redacted] wks pregnant Sciatic has resolved during pregnancy Was allowed to cont gabapentin and this has helped stabilize pain Now back to 600 qid Doing more rest Short walks   Past Hx Migraine - now using botox injections q 3 mos and effective Needed rescus meds twice during pregnancy  ROS No weakness in left leg No bowel or bladder issues  PE Pleasant pregnant F BP 136/78   Ht 5\' 7"  (1.702 m)   Wt 230 lb (104.3 kg)   BMI 36.02 kg/m   Looks puffy but no pitting edema in legs BP per patient has stayed stable  Back Flexion OK Extension OK at 20 deg Lateral bend left and RT ok Able to walk heel and toe No palpable Tenderness lumbar

## 2017-02-03 ENCOUNTER — Ambulatory Visit: Payer: BC Managed Care – PPO | Admitting: Sports Medicine

## 2017-02-17 ENCOUNTER — Ambulatory Visit: Payer: BC Managed Care – PPO | Admitting: Sports Medicine

## 2017-05-25 ENCOUNTER — Ambulatory Visit: Payer: BC Managed Care – PPO | Admitting: Sports Medicine

## 2017-05-25 ENCOUNTER — Encounter: Payer: Self-pay | Admitting: Sports Medicine

## 2017-05-25 VITALS — BP 114/84 | Ht 67.0 in | Wt 200.0 lb

## 2017-05-25 DIAGNOSIS — M545 Low back pain, unspecified: Secondary | ICD-10-CM

## 2017-05-25 MED ORDER — NAPROXEN 500 MG PO TABS: ORAL_TABLET | ORAL | 0 refills | Status: DC

## 2017-05-25 NOTE — Progress Notes (Signed)
   Subjective:    Patient ID: Sandra Rocha, female    DOB: [birth year], 42 y.o.   MRN: [id number]  HPI chief complaint: Low back pain  Very pleasant 42 year old female comes in today complaining of right-sided low back and posterior hip pain that began after she fell 3 days ago. She fell directly backwards landing on her posterior right hip. Immediate pain. She used both Aleve and ice through the weekend and both have been somewhat helpful. Her pain is most noticeable with sitting and lying down. She denies any radiating pain down her legs. No numbness or tingling. She does have a history of lumbar degenerative disc disease which has been treated in the past with gabapentin, tramadol, steroids, and hydrocodone. Her current pain is different in nature than what she experienced with her previous sciatica. She denies any groin pain.  Interim medical history reviewed Medications reviewed Allergies reviewed    Review of Systems As above    Objective:   Physical Exam  Well-developed, well-nourished. No acute distress. Awake alert and oriented 3. Vital signs reviewed  Lumbar spine: There is no tenderness to palpation or percussion along the lumbar midline. Patient has full lumbar range of motion with some pain with extension. She is most tender to palpation diffusely along the posterior hip. No obvious soft tissue swelling. No palpable hematoma.  Right hip: Smooth painless hip range of motion with a negative logroll  Neurological exam: Reflexes are brisk and equal at the Achilles and patellar tendons. Strength is 5/5 bilaterally. Sensation is intact to light touch grossly.      Assessment & Plan:   Back pain and posterior right hip pain likely secondary to contusion.  Naproxen sodium 500 mg twice daily with food for 7 days then when necessary. Continue with ice as needed. No heat. Resume activity as tolerated. If symptoms do not improve over the next several days then consider x-rays  to rule out fracture although clinical suspicion for that is low at this time.

## 2017-05-26 ENCOUNTER — Ambulatory Visit: Payer: BC Managed Care – PPO | Admitting: Sports Medicine

## 2017-07-06 ENCOUNTER — Other Ambulatory Visit: Payer: Self-pay | Admitting: Sports Medicine

## 2017-07-10 ENCOUNTER — Ambulatory Visit: Payer: BC Managed Care – PPO | Admitting: Family Medicine

## 2017-07-10 ENCOUNTER — Encounter: Payer: Self-pay | Admitting: Family Medicine

## 2017-07-10 VITALS — BP 117/81 | HR 100 | Ht 67.0 in | Wt 205.0 lb

## 2017-07-10 DIAGNOSIS — M545 Low back pain, unspecified: Secondary | ICD-10-CM

## 2017-07-10 MED ORDER — MELOXICAM 15 MG PO TABS: 15.0000 mg | ORAL_TABLET | Freq: Every day | ORAL | 2 refills | Status: DC

## 2017-07-10 MED ORDER — CYCLOBENZAPRINE HCL 10 MG PO TABS: ORAL_TABLET | ORAL | 0 refills | Status: DC

## 2017-07-10 MED ORDER — TRAMADOL HCL 50 MG PO TABS: ORAL_TABLET | ORAL | 0 refills | Status: DC

## 2017-07-13 NOTE — Assessment & Plan Note (Signed)
We will schedule her for x-rays.  We will add low-dose cyclobenzaprine at night. 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 Darrick Penna.  She has worsening or new symptoms, she will call or return immediately.  I reassured her regarding the pain with straining for bowel movement.  She is not having any incontinence.  The pelvic floor uses some low back muscles during bowel movement and she is probably just having pain from the strain.  She seemed relieved to hear this.  Continue her other current medications.

## 2017-07-13 NOTE — Progress Notes (Signed)
    CHIEF COMPLAINT / HPI: Low back pain worse over the last 7 to 10 days.  Had a fall last month and had some increase in back pain but then it got a little bit better.  In the last week or so it is gotten worse again and this time she is having pain with bowel movements which worried her.  Pain is in the lumbar area with a little worse on the right.  No numbness or tingling in her legs.  No loss of strength in her legs, no weakness in the legs.  Feels steady when she walks.  Does have trouble rising from a chair secondary to back pain, cannot lean over without discomfort due to low back pain.  Has had no bowel or bladder incontinence, just pain with straining for bowel movement.  REVIEW OF SYSTEMS:  see hpi.  Additionally, no fever, no unusual weight loss.  No abdominal pain.  No urinary frequency urgency or burning.  PERTINENT  PMH / PSH: I have reviewed the patient's medications, allergies, past medical and surgical history, smoking status and updated in the EMR as appropriate. Fall at home third week of March with some subsequent increase in right-sided low back pain and posterior hip pain. History of lumbar DJD.   OBJECTIVE: GENERAL: Well-developed female no acute distress BACK: Tender to palpation lumbar musculature right greater than left.  There is some spasm noted here.  No deformity of the spine.  The lumbar and thoracic vertebra are non-tender to percussion.  She can flex at the hips to about 70 degrees.  Hyperextension is extremely painful but she can extend about 10 degrees.  Thoracic rotation limited by stiffness but painless.  Is EXTREMITY: Strength hip flexor extensor 5 out of 5 symmetrical.  Flex and extension at  Knee, ankle, dorsiflexion and plantarflexion 5 out of 5 symmetrical. Neuro: Intact sensation soft touch bilateral lower extremity.  Normal gait.  Soft touch sensation bilateral symmetrical lower extremities. DTRs 2+ bilateral symmetrical at the knee. Psychiatric: Alert  and oriented x4.  Affect is interactive.  Speech is normal fluency and content.  Judgment is intact.  ASSESSMENT / PLAN: Please see problem oriented charting for details

## 2017-07-30 ENCOUNTER — Ambulatory Visit: Payer: BC Managed Care – PPO | Admitting: Sports Medicine

## 2017-08-05 ENCOUNTER — Telehealth: Payer: Self-pay

## 2017-08-05 ENCOUNTER — Other Ambulatory Visit: Payer: Self-pay

## 2017-08-05 MED ORDER — TRAMADOL HCL 50 MG PO TABS: ORAL_TABLET | ORAL | 0 refills | Status: DC

## 2017-08-05 NOTE — Telephone Encounter (Signed)
Refill ordered. Pt will see Dr. Darrick PennaFields in a few weeks for f/u appt.

## 2017-08-06 ENCOUNTER — Ambulatory Visit
Admission: RE | Admit: 2017-08-06 | Discharge: 2017-08-06 | Disposition: A | Discharge status: Home / Self Care | Payer: BC Managed Care – PPO | Source: Ambulatory Visit | Attending: Family Medicine | Admitting: Family Medicine

## 2017-08-06 DIAGNOSIS — M545 Low back pain, unspecified: Secondary | ICD-10-CM

## 2017-08-18 ENCOUNTER — Other Ambulatory Visit: Payer: Self-pay | Admitting: Sports Medicine

## 2017-08-21 ENCOUNTER — Telehealth: Payer: Self-pay | Admitting: Family Medicine

## 2017-08-21 NOTE — Telephone Encounter (Signed)
Sandra Rocha X rays show some mild to moderate progression of Rocha low back arthritis. Nothing horrible. THANKS! Denny LevySara Sarajane Fambrough

## 2017-08-21 NOTE — Telephone Encounter (Signed)
-----   [facility] from [facility] sent at 08/17/2017  2:58 PM EDT ----- Pt called looking for her x-ray results. I can call her if you want to check out the images and let me know what to tell her. Thanks!

## 2017-08-25 ENCOUNTER — Ambulatory Visit: Payer: BC Managed Care – PPO | Admitting: Sports Medicine

## 2017-08-25 ENCOUNTER — Encounter: Payer: Self-pay | Admitting: Sports Medicine

## 2017-08-25 VITALS — BP 118/60 | Ht 67.0 in | Wt 200.0 lb

## 2017-08-25 DIAGNOSIS — M545 Low back pain, unspecified: Secondary | ICD-10-CM

## 2017-08-25 MED ORDER — TRAMADOL HCL 50 MG PO TABS: 50.0000 mg | ORAL_TABLET | Freq: Three times a day (TID) | ORAL | 0 refills | Status: DC

## 2017-08-25 MED ORDER — PREDNISONE 20 MG PO TABS: 20.0000 mg | ORAL_TABLET | Freq: Two times a day (BID) | ORAL | 0 refills | Status: DC

## 2017-08-25 NOTE — Progress Notes (Signed)
Subjective:    Sandra Rocha is a 42 y.o. old female here with right low back pain.  Patient with history of lumbar radiculopathy, depression and gait disturbance.  HPI Right lower back pain: patient fell back on her backside when she was trying to pull out the car seat about 2 months ago.  She was seen in our clinic and told to have contusion from her fall.  She was treated with Aleve 500 mg twice daily for 7 days and had significant improvement in her pain.  She was also seen in this clinic about 6 weeks ago for lower back pain.  Lumbar x-ray obtained at that time showed some arthritic changes without acute finding.  She was prescribed Flexeril and tramadol.   Today, she reports flareup of her right-sided lower back pain for the last 1 week. Denies interval trauma, injury or unusual activity.  She describes the pain as achy and sharp.  Denies radiation to her legs.  Denies pain in her groin.  Pain is worse with sitting and lying down sometimes waking her up from sleep.  She has been managing her pain with Tylenol and gabapentin. Note she has been carrying toddler as her husband had recent surgery and also doing all needed lifting  Denies numbness or tingling.  Denies bowel or bladder habit changes.  Denies fever or chills.  Of note, patient reports return of her menstruation affter she stopped breast-feeding. She had her second period about two weeks ago which was heavy and painful.  [facility]  She had a pelvic and transvaginal ultrasound which was read as normal.  PMH/Problem List: has DERMATOPHYTOSIS OF OTHER SPECIFIED SITES; ACUTE SINUSITIS, UNSPECIFIED; Lumbar radiculopathy; NECK PAIN; Low back pain radiating to left leg; WEAKNESS; RASH AND OTHER NONSPECIFIC SKIN ERUPTION; Gait disturbance; Low back pain; Migraine; Sleep apnea; and Left hip pain on their problem list.   has a past medical history of Anxiety, Headache(784.0), Low back pain, and Neuropathy.  FH:  Family  History  Problem Relation Age of Onset  . Migraines Mother   . Depression Mother   . Migraines Maternal Grandfather   . Depression Father     SH Social History   Tobacco Use  . Smoking status: Never Smoker  . Smokeless tobacco: Never Used  Substance Use Topics  . Alcohol use: No    Alcohol/week: 0.0 oz  . Drug use: No    Review of Systems Review of systems negative except for pertinent positives and negatives in history of present illness above.  No left sided sciatica as in past No weakness in lower extremities    Objective:     Vitals:   08/25/17 1146  BP: 118/60  Weight: 200 lb (90.7 kg)  Height: 5\' 7"  (1.702 m)   Body mass index is 31.32 kg/m.  Physical Exam  GEN: appears well & comfortable. No apparent distress. Head: normocephalic and atraumatic  RESP: no IWOB GU: mild suprapubic tenderness but no CVA tenderness. MSK: Back & LE exam  Normal skin, spine with normal alignment and no deformity. LE symmetric appears symmetric  No step offs, no tenderness to palpation over spinous processes.  Tenderness to palpation over her right buttock mainly over right SI joint.  No tenderness over greater trochanters.  Limited back flexion to about 120 degrees.  Positive FABER in the right.   Lying and seated SLR negative  Neuro exam in LE: motor 4/5 in all muscle groups, light sensation intact  in L4-S1 dermatomes, patellar reflexes 2+ bilaterally  SKIN: no apparent skin lesion PSYCH: euthymic mood with congruent affect    Assessment and Plan:  1. Acute right-sided low back pain without sciatica: she has tenderness to palpation over her right SI joint and positive FABER on the right suggesting SI joint arthropathy.  She has no focal neuro deficit.  Recent lumbar x-ray with degenerative changes which favors arthropathy.  Although there is some temporal association with her menstrual cycle, her recent GYN ultrasound at [facility] is reassuring. No red flags.  She has no bowel  or bladder habit change.  Will treat with prednisone 20 mg twice daily for 7 days. Refilled her tramadol today.   Almon Hercules, MD 08/25/17 Pager: [phone]  I observed and examined the patient with the resident and agree with assessment and plan.  Note reviewed and modified by me. Enid Baas, MD

## 2017-09-28 ENCOUNTER — Other Ambulatory Visit: Payer: Self-pay | Admitting: Sports Medicine

## 2017-09-29 ENCOUNTER — Telehealth: Payer: Self-pay | Admitting: Sports Medicine

## 2017-09-29 NOTE — Telephone Encounter (Signed)
[facility] on [facility] [place]  Please call with any questions, [phone]

## 2017-11-05 ENCOUNTER — Other Ambulatory Visit: Payer: Self-pay | Admitting: *Deleted

## 2017-11-05 ENCOUNTER — Other Ambulatory Visit: Payer: Self-pay | Admitting: Sports Medicine

## 2017-11-05 MED ORDER — GABAPENTIN 600 MG PO TABS: 600.0000 mg | ORAL_TABLET | Freq: Three times a day (TID) | ORAL | 0 refills | Status: DC

## 2017-12-02 ENCOUNTER — Other Ambulatory Visit: Payer: Self-pay | Admitting: Sports Medicine

## 2017-12-03 ENCOUNTER — Other Ambulatory Visit: Payer: Self-pay

## 2017-12-03 MED ORDER — GABAPENTIN 600 MG PO TABS: 600.0000 mg | ORAL_TABLET | Freq: Three times a day (TID) | ORAL | 5 refills | Status: DC

## 2017-12-24 ENCOUNTER — Ambulatory Visit: Payer: Self-pay | Admitting: Sports Medicine

## 2018-01-12 ENCOUNTER — Ambulatory Visit (INDEPENDENT_AMBULATORY_CARE_PROVIDER_SITE_OTHER): Payer: BC Managed Care – PPO | Admitting: Sports Medicine

## 2018-01-12 ENCOUNTER — Encounter: Payer: Self-pay | Admitting: Sports Medicine

## 2018-01-12 DIAGNOSIS — M5416 Radiculopathy, lumbar region: Secondary | ICD-10-CM | POA: Diagnosis not present

## 2018-01-12 NOTE — Progress Notes (Signed)
CC: Low Back Pain  Pain flares to 5 or 6 of 10 Recent flare after fell while controlling dog Generally has had fewer episodes Most days pain < 2 Stopped meloxicam and naprosyn Stopped tramadol Tried stop gabapentin and pain came back TID gabapentin blocks most of radiation into buttocks and legs  ROS No bowel or bladder sxs No numbness in feet No ture sciatica  PE Pleasant F in NAD BP 123/66   Ht 5\' 7"  (1.702 m)   Wt 200 lb (90.7 kg)   BMI 31.32 kg/m   Full flexion of LS w no pain Lateral bend left pain at 20 deg Lat. Bend RT pain at 30 deg Extesnion - no pain but limited to 20 de Rotation norm Toe walk Nl Heel walk Nl

## 2018-01-12 NOTE — Assessment & Plan Note (Signed)
Gabapentin seems to be controlling pain best in several years Restart flexion exercise series We discussed aerobic fitness program May need to walk more and run less  Reck 4 mos

## 2018-02-01 ENCOUNTER — Ambulatory Visit: Payer: BC Managed Care – PPO | Admitting: Sports Medicine

## 2018-02-01 ENCOUNTER — Encounter (INDEPENDENT_AMBULATORY_CARE_PROVIDER_SITE_OTHER): Payer: Self-pay

## 2018-02-01 ENCOUNTER — Encounter: Payer: Self-pay | Admitting: Sports Medicine

## 2018-02-01 VITALS — BP 110/80 | Ht 67.0 in | Wt 190.0 lb

## 2018-02-01 DIAGNOSIS — M79672 Pain in left foot: Secondary | ICD-10-CM | POA: Diagnosis not present

## 2018-02-01 MED ORDER — TRAMADOL HCL 50 MG PO TABS: 50.0000 mg | ORAL_TABLET | Freq: Two times a day (BID) | ORAL | 0 refills | Status: DC | PRN

## 2018-02-01 MED ORDER — PREDNISONE 10 MG PO TABS: ORAL_TABLET | ORAL | 0 refills | Status: DC

## 2018-02-01 NOTE — Progress Notes (Signed)
  Subjective:     Patient ID: Sandra Rocha, female   DOB: 09-09-1975, 42 y.o.   MRN: [id number]  HPI  Sandra Rocha is a 42 year old female who presents to clinic for worsening L foot pain x 1 week.  Her pain initially was on the plantar aspect of her midfoot and she experienced recent trauma of dropping a dish on the dorsal aspect of her L foot so now the pain wraps around in a band-like fashion.  She describes it as constant sharp and throbbing pain.  It is aggravated by movement and alleviated by resting and elevating the leg.  It does not radiate anywhere.  She has been treating it with Ibuprofen, Aleve and Meloxicam with minimal relief.  She rates it 8/10 intensity. She has increased her physical activity of late and is walking/running 2 miles 2-3 days/week.  She has seen Dr. Darrick PennaFields for orthotics, but has not used them in a couple of years.  Review of Systems  Musculoskeletal: Negative for back pain and joint pain.  Neurological: Negative for tingling.      Objective:   Physical Exam   Well-developed, well-nourished.  No acute distress.  Awake alert and oriented x3.  Vital signs reviewed.  Left foot  Inspection: No erythema or swelling to dorsal aspect of L foot Palpation: Patient diffusely tender to palpation around the forefoot.  There is tenderness along the metatarsal heads on the plantar aspect of the foot as well as along the distal metatarsal shafts on the dorsum of the foot.  No tenderness to palpation at the calcaneus. Range of Motion: Pain with plantar flexion and inversion of foot Strength: 5/5 strength with dorsiflexion and plantar flexion. Stability: n/a Special Tests: n/a Neurovascular Status: Decreased sensation to L great toe, unchanged from baseline     Assessment:     Sandra Rocha is a 42 year old female who presents to clinic for worsening L foot pain x 1 week.   Suspect stress fracture vs stress reaction vs. metatarsalgia given history of increased  activity and physical exam.    Plan:     Will obtain L foot x-ray to r/o stress fracture.  Will order boot and crutches to limit weight bearing as tolerated. Will prescribe Tramadol for pain.  Patient encouraged to rest and ice foot. Follow-up in 2 weeks. Plan to discuss re-introduction of patient's orthotics.   Patient seen and evaluated with the medical student.  I agree with the above plan of care.  Patient has diffuse forefoot pain.  Differential diagnosis includes metatarsalgia versus stress reaction versus stress fracture.  Patient is placed into a short cam walker and given crutches to help assist with ambulation.  6-day Sterapred Dosepak to take as directed.  Tramadol as needed for more severe pain.  Follow-up in the office in 2 weeks for reevaluation.  I will check an x-ray of her foot in the meantime.  If symptoms persist and x-ray is unremarkable then we may need to consider merits of further diagnostic imaging at that visit.  Of note, patient has custom orthotics but has not been wearing them.  I have asked her to bring those orthotics to her next office visit.  We may need to construct new orthotics or adjust the old ones.

## 2018-02-04 ENCOUNTER — Ambulatory Visit
Admission: RE | Admit: 2018-02-04 | Discharge: 2018-02-04 | Disposition: A | Discharge status: Home / Self Care | Payer: BC Managed Care – PPO | Source: Ambulatory Visit | Attending: Sports Medicine | Admitting: Sports Medicine

## 2018-02-04 DIAGNOSIS — M79672 Pain in left foot: Secondary | ICD-10-CM

## 2018-02-15 ENCOUNTER — Ambulatory Visit: Payer: BC Managed Care – PPO | Admitting: Sports Medicine

## 2018-03-02 ENCOUNTER — Ambulatory Visit: Payer: BC Managed Care – PPO | Admitting: Sports Medicine

## 2018-03-02 ENCOUNTER — Encounter: Payer: Self-pay | Admitting: Sports Medicine

## 2018-03-02 VITALS — BP 118/84 | Ht 67.0 in | Wt 190.0 lb

## 2018-03-02 DIAGNOSIS — M79672 Pain in left foot: Secondary | ICD-10-CM

## 2018-03-02 NOTE — Progress Notes (Signed)
   Subjective:    Patient ID: Sandra Rocha, female    DOB: 12-25-1975, 42 y.o.   MRN: [id number]  HPI   [name] comes in today for follow-up on left foot pain.  She is still experiencing pain, although she does admit that it feels better in her walking boot.  Pain is primarily along the plantar aspect of the foot diffuse along the metatarsal heads but she does endorse occasional pain along the lateral foot as well as swelling of the foot as well.  Previous x-rays were unremarkable.   Review of Systems    As above Objective:   Physical Exam  Well-developed, well-nourished.  No acute distress.  Awake alert and oriented x3.  Vital signs reviewed  Left foot: She is tender to palpation diffusely along the plantar aspect of the foot, specifically along the area of the metatarsal heads.  She also has tenderness to palpation along the dorsal aspect of the foot, primarily along the fourth and fifth metatarsals.  No obvious soft tissue swelling.  No erythema.  Good pulses.  Pain with metatarsal squeeze.  Neurovascularly intact distally.  Walking with a limp  X-rays are as above      Assessment & Plan:   Persistent left foot pain and swelling-rule out metatarsal stress reaction versus stress fracture  Given her persistent pain despite CAM Walker immobilization I am going to get an MRI of her foot specifically to rule out a stress reaction or stress fracture versus simple metatarsalgia.  Phone follow-up with those findings when available.  We will delineate more definitive treatment based on those results.  In the meantime, patient will remain in her cam walker when ambulating.

## 2018-03-07 ENCOUNTER — Ambulatory Visit
Admission: RE | Admit: 2018-03-07 | Discharge: 2018-03-07 | Disposition: A | Discharge status: Home / Self Care | Payer: BC Managed Care – PPO | Source: Ambulatory Visit | Attending: Sports Medicine | Admitting: Sports Medicine

## 2018-03-07 DIAGNOSIS — M79672 Pain in left foot: Secondary | ICD-10-CM

## 2018-03-09 ENCOUNTER — Telehealth: Payer: Self-pay | Admitting: Sports Medicine

## 2018-03-09 NOTE — Telephone Encounter (Signed)
  I spoke with Sandra Rocha on the phone today after reviewing the MRI of her left foot.  She has a nondisplaced hairline fracture at the base of the fourth metatarsal with surrounding proximal fourth metatarsal marrow edema and soft tissue swelling.  Based on these findings I recommended that she continue in her cam walker until follow-up with me in 3 weeks.  At follow-up I will plan on doing a bedside ultrasound and possibly a follow-up x-ray.

## 2018-03-29 ENCOUNTER — Ambulatory Visit
Admission: RE | Admit: 2018-03-29 | Discharge: 2018-03-29 | Disposition: A | Discharge status: Home / Self Care | Payer: BC Managed Care – PPO | Source: Ambulatory Visit | Attending: Sports Medicine | Admitting: Sports Medicine

## 2018-03-29 ENCOUNTER — Ambulatory Visit: Payer: BC Managed Care – PPO | Admitting: Sports Medicine

## 2018-03-29 VITALS — BP 110/68 | Ht 67.0 in | Wt 190.0 lb

## 2018-03-29 DIAGNOSIS — M25572 Pain in left ankle and joints of left foot: Secondary | ICD-10-CM | POA: Diagnosis not present

## 2018-03-29 DIAGNOSIS — S92902D Unspecified fracture of left foot, subsequent encounter for fracture with routine healing: Secondary | ICD-10-CM

## 2018-03-30 NOTE — Progress Notes (Signed)
   Subjective:    Patient ID: Sandra Rocha, female    DOB: 1975-05-04, 43 y.o.   MRN: [id number]  HPI  Sandra Rocha comes in today for follow-up on a nondisplaced proximal fourth metatarsal fracture of her left foot.  She is improving.  She has no pain when wearing the cam walker.  She does notice pain if she walks without the cam walker on.  She continues to localize her pain along the lateral aspect of the foot where a recent MRI showed a hairline fracture of the proximal fourth metatarsal as well as edema in the fourth and fifth metatarsals.  Overall, she is doing well.   Review of Systems    As above Objective:   Physical Exam  Well-developed, well-nourished.  No acute distress.  Awake alert and oriented x3.  Vital signs reviewed  Left foot: There is tenderness to palpation along the proximal fourth and fifth metatarsals.  No soft tissue swelling.  Skin is intact.  No erythema.  Good pulses.  Patient is able to ambulate without a limp in her cam walker.  X-rays today of the left foot including AP, lateral, and oblique views show evidence of a healing nondisplaced proximal fourth metatarsal fracture.  No other fractures are seen.      Assessment & Plan:   Healing proximal fourth metatarsal fracture, left foot  Patient needs to remain in her cam walker at least until follow-up with me in 4 weeks.  We will repeat x-rays at that time including AP, lateral, and oblique views.  We will determine the need for further immobilization based on her clinical exam and x-ray findings at that time.  Patient will call with questions or concerns in the interim.

## 2018-04-19 ENCOUNTER — Other Ambulatory Visit: Payer: Self-pay | Admitting: *Deleted

## 2018-04-19 MED ORDER — GABAPENTIN 600 MG PO TABS: 600.0000 mg | ORAL_TABLET | Freq: Three times a day (TID) | ORAL | 3 refills | Status: DC

## 2018-04-26 ENCOUNTER — Ambulatory Visit: Payer: BC Managed Care – PPO | Admitting: Sports Medicine

## 2018-04-27 ENCOUNTER — Other Ambulatory Visit: Payer: Self-pay | Admitting: *Deleted

## 2018-04-27 ENCOUNTER — Ambulatory Visit
Admission: RE | Admit: 2018-04-27 | Discharge: 2018-04-27 | Disposition: A | Discharge status: Home / Self Care | Payer: BC Managed Care – PPO | Source: Ambulatory Visit | Attending: Sports Medicine | Admitting: Sports Medicine

## 2018-04-27 ENCOUNTER — Ambulatory Visit: Payer: BC Managed Care – PPO | Admitting: Sports Medicine

## 2018-04-27 VITALS — BP 125/76 | Ht 67.0 in | Wt 195.0 lb

## 2018-04-27 DIAGNOSIS — M79672 Pain in left foot: Secondary | ICD-10-CM

## 2018-04-27 DIAGNOSIS — M25572 Pain in left ankle and joints of left foot: Secondary | ICD-10-CM

## 2018-04-28 NOTE — Patient Instructions (Signed)
[facility] Dr Nicki Guadalajara Monday March 9th at 215p [address] [place] [phone]

## 2018-04-28 NOTE — Progress Notes (Signed)
Patient ID: Sandra Rocha, female   DOB: [birth year], 43 y.o.   MRN: [id number]   Patient comes in today for follow-up on a proximal fourth metatarsal fracture of the left foot.  Pain is improving but has not resolved.  She is relatively pain-free in her cam walker but has pain when walking out of it.  No swelling.  Physical exam: Physical exam of the left foot shows no soft tissue swelling.  She does remain tender to palpation at the base of the fourth metatarsal.  Mild pain with metatarsal squeeze.  No other bony tenderness appreciated.  Neurovascularly intact distally.  X-ray of the left foot including AP, lateral, and oblique views shows persistent fracture at the base of the fourth metatarsal.  There is sclerosis which suggests some healing in this area.   Assessment/plan:  Proximal fourth metatarsal fracture: Although the patient is improving in her cam walker, I would like to refer to Dr. Nicki Guadalajara at [facility] for his opinion.  The proximal fourth metatarsal can be a high risk area for fracture or stress fracture.  She will remain in her cam walker until she sees Dr. Susa Simmonds and I will defer further work-up and treatment to his discretion.

## 2018-05-13 ENCOUNTER — Other Ambulatory Visit: Payer: Self-pay | Admitting: Orthopaedic Surgery

## 2018-05-13 DIAGNOSIS — M84375A Stress fracture, left foot, initial encounter for fracture: Secondary | ICD-10-CM

## 2018-05-25 ENCOUNTER — Inpatient Hospital Stay: Admission: RE | Admit: 2018-05-25 | Payer: BC Managed Care – PPO | Source: Ambulatory Visit

## 2018-07-12 ENCOUNTER — Other Ambulatory Visit: Payer: Self-pay | Admitting: *Deleted

## 2018-07-12 ENCOUNTER — Other Ambulatory Visit: Payer: Self-pay | Admitting: Sports Medicine

## 2018-07-12 MED ORDER — GABAPENTIN 600 MG PO TABS: 600.0000 mg | ORAL_TABLET | Freq: Three times a day (TID) | ORAL | 0 refills | Status: DC

## 2018-08-09 ENCOUNTER — Other Ambulatory Visit: Payer: Self-pay | Admitting: Sports Medicine

## 2018-09-14 ENCOUNTER — Other Ambulatory Visit: Payer: Self-pay

## 2018-09-14 ENCOUNTER — Encounter: Payer: Self-pay | Admitting: Sports Medicine

## 2018-09-14 ENCOUNTER — Other Ambulatory Visit: Payer: BC Managed Care – PPO

## 2018-09-14 ENCOUNTER — Ambulatory Visit: Payer: BC Managed Care – PPO | Admitting: Sports Medicine

## 2018-09-14 VITALS — BP 128/80 | Ht 67.0 in | Wt 195.0 lb

## 2018-09-14 DIAGNOSIS — M5416 Radiculopathy, lumbar region: Secondary | ICD-10-CM | POA: Diagnosis not present

## 2018-09-14 MED ORDER — TRAMADOL HCL 50 MG PO TABS: 50.0000 mg | ORAL_TABLET | Freq: Three times a day (TID) | ORAL | 0 refills | Status: DC

## 2018-09-14 MED ORDER — PREDNISONE 10 MG PO TABS: ORAL_TABLET | ORAL | 0 refills | Status: DC

## 2018-09-14 NOTE — Patient Instructions (Signed)
Follow up as needed

## 2018-09-14 NOTE — Progress Notes (Signed)
HPI: Ms. Lumbra is presenting c 4 day onset of R sided low back pain that radiates to her buttock after reaching over to pick up 29mo old infant and hearing a pop. Alleviating factors include ice, aleve, gabapentin. Aggravated with flexion of waist, sleeping on her back. Radiating pain does not go below her knee. Previous MR shows L5-S1 disc pathology, has needed PO steroids in the past. This is the first flare in 1 year.  Reviewed and updated PMH, meds, allergies, social/family history  ROS: General: No fever, sweats, chills, weight loss, HA Pulm: No cough, dyspnea GI: No N/V/D, melena, change in bowel function GU: No dysuria, flank pain Neuro: No numbness, tingling, loss of bowel function Skin: No rash, swelling, bleeding, bruising  Objective: General: well developed, well nourished, in no acute distress. Pulses: 2+ pulses PT Skin: no rashes, skin intact, (-) surgical scars, (-) ecchymosis Nerve: gross sensation to bilateral LE nerve roots intact Gait: (X) normal/ambulatory (_) antalgic (_) abnormal posture Lumbosacral Exam: Inspection-deformity: none noted Curvature: (X) normal (_) hyperlordotic (_) hypolordotic Palpation-Spinal Tenderness: no TTP Range of Motion: Forward Flexion: 50 degrees (0-60) Hyperextension: 20 degrees (0-25) Right Lateral Bend: 45 degrees (0-45) Left Lateral Bend: 45 degrees (0-45) Right Rotation: 60 degrees (0-60) Left Rotation: 60 degrees (0-60) Squatting: WNL  Neurological Exam:  Nerve Root Sensory Loss Motor/Strength Loss Reflexes  L3 (L2-L3 disc) (_) Anteromedial Thigh (_) Hip Flexion/Quad   L4 (L3-L4 disc) (_) Anteromedial Leg, Medial Foot (_) Knee Extension/Quad Patellar 2+ Right Patellar 2+ Left  L5 (L4-L5 disc) (_) Lateral Leg, Dorsum Foot (_) Great Toe/Foot Dorsiflexion   S1 (L5-S1) (_) Calf, Lateral Foot (_) Plantarflex Foot Achilles 2+ Right Achilles 2+ Left   Special Tests:  Test Right Left  SLR (+) (-)  FABER (+) (-)  FADIR (-) (-)   Piriformis Stretch (-) (-)  Hamstring Tightness (+) (+)  Quad Tightness (-) (-)  Ober's Test (-) (-)  Pelvic Rock (+) (-)   Assessment and Plan 1. Low back pain with radiculopathy  - PO steroids for 6 days - Tramadol 50mg  prn TID #30 pills - Home core exercises discussed - F/u prn  Lanier Clam, DO, ATC Sports Medicine Fellow  Patient seen and evaluated with the sports medicine fellow.  I agree with the above plan of care.  Patient has a history of low back pain and occasional radiculopathy.  Her symptoms are identical to what she is experienced previously.  Going to put her on a 6-day Sterapred Dosepak and have given her tramadol to take as needed for pain.  Slowly increase activity as tolerated.  If symptoms persist, we may need to consider updated imaging.  Follow-up for ongoing or recalcitrant issues.

## 2018-10-05 ENCOUNTER — Other Ambulatory Visit: Payer: Self-pay | Admitting: Sports Medicine

## 2018-12-01 ENCOUNTER — Other Ambulatory Visit: Payer: Self-pay | Admitting: Sports Medicine

## 2018-12-27 ENCOUNTER — Other Ambulatory Visit: Payer: Self-pay | Admitting: Sports Medicine

## 2019-01-12 ENCOUNTER — Other Ambulatory Visit: Payer: Self-pay

## 2019-01-12 ENCOUNTER — Ambulatory Visit: Payer: BC Managed Care – PPO | Admitting: Family Medicine

## 2019-01-12 VITALS — BP 120/82 | Ht 67.0 in | Wt 195.0 lb

## 2019-01-12 DIAGNOSIS — M5416 Radiculopathy, lumbar region: Secondary | ICD-10-CM | POA: Diagnosis not present

## 2019-01-12 MED ORDER — TRAMADOL HCL 50 MG PO TABS: 50.0000 mg | ORAL_TABLET | Freq: Three times a day (TID) | ORAL | 0 refills | Status: DC | PRN

## 2019-01-12 MED ORDER — GABAPENTIN 600 MG PO TABS: 600.0000 mg | ORAL_TABLET | Freq: Three times a day (TID) | ORAL | 0 refills | Status: DC

## 2019-01-12 MED ORDER — PREDNISONE 10 MG PO TABS: ORAL_TABLET | ORAL | 0 refills | Status: DC

## 2019-01-12 MED ORDER — KETOROLAC TROMETHAMINE 60 MG/2ML IM SOLN
60.0000 mg | Freq: Once | INTRAMUSCULAR | Status: AC
  Administered 2019-01-12: 60 mg via INTRAMUSCULAR

## 2019-01-12 NOTE — Progress Notes (Signed)
PCP: Darcus Austin, MD (Inactive)  Subjective:   HPI: Patient is a 43 y.o. female here for new right side low back pain. She has Hx of chronic back pain (mostly at the left side) that got slightly worse recently after helping ambulate her mother for past couple of weeks (mother recently had back surgery). Patient takes 600 mg Gabapentin TID at home for chronic left low back pain. On sunday night and after she picked up her 28 y/o daughter, she experienced sharp pain at right lower back and right buttock that radiated to her right leg, passed the knee. The pain is 9/10 and constant. Denies any numbness, tingling, focal lower extremity weakness. No urinary or fecal incontinency. Tylenol, Aleve and Biofreeze did not help with pain. She came to the clinic today for further management.   Past Medical History:  Diagnosis Date  . Anxiety   . Headache(784.0)   . Low back pain   . Neuropathy     Current Outpatient Medications on File Prior to Visit  Medication Sig Dispense Refill  . gabapentin (NEURONTIN) 600 MG tablet Take 1 tablet (600 mg total) by mouth 3 (three) times daily. 90 tablet 0  . MAGNESIUM ASPARTATE PO Take by mouth daily.    . Multiple Vitamins-Minerals (MULTIVITAMIN PO) Take by mouth daily.    . rizatriptan (MAXALT-MLT) 10 MG disintegrating tablet Take 1 tablet (10 mg total) by mouth as needed. May repeat in 2 hours if needed 10 tablet 6  . Thiamine HCl (VITAMIN B-1 PO) Take by mouth daily.    . meloxicam (MOBIC) 15 MG tablet Take 1 tablet (15 mg total) by mouth daily. (Patient not taking: Reported on 01/12/2019) 30 tablet 2  . PARoxetine (PAXIL) 10 MG tablet Take 10 mg by mouth daily.    . predniSONE (DELTASONE) 10 MG tablet Take as directed per MD instructions (Patient not taking: Reported on 01/12/2019) 21 tablet 0  . traMADol (ULTRAM) 50 MG tablet Take 1 tablet (50 mg total) by mouth 3 (three) times daily. (Patient not taking: Reported on 01/12/2019) 30 tablet 0   No current  facility-administered medications on file prior to visit.     Past Surgical History:  Procedure Laterality Date  . CESAREAN SECTION      No Known Allergies  Social History   Socioeconomic History  . Marital status: Married    Spouse name: [name]  . Number of children: 2  . Years of education: college  . Highest education level: Not on file  Occupational History    Employer: [employer]  Social Needs  . Financial resource strain: Not on file  . Food insecurity    Worry: Not on file    Inability: Not on file  . Transportation needs    Medical: Not on file    Non-medical: Not on file  Tobacco Use  . Smoking status: Never Smoker  . Smokeless tobacco: Never Used  Substance and Sexual Activity  . Alcohol use: No    Alcohol/week: 0.0 standard drinks  . Drug use: No  . Sexual activity: Yes    Birth control/protection: Pill  Lifestyle  . Physical activity    Days per week: Not on file    Minutes per session: Not on file  . Stress: Not on file  Relationships  . Social Herbalist on phone: Not on file    Gets together: Not on file    Attends religious service: Not on file  Active member of club or organization: Not on file    Attends meetings of clubs or organizations: Not on file    Relationship status: Not on file  . Intimate partner violence    Fear of current or ex partner: Not on file    Emotionally abused: Not on file    Physically abused: Not on file    Forced sexual activity: Not on file  Other Topics Concern  . Not on file  Social History Narrative   Patient lives at home with her husband [name])   Patient  Works full time IT trainer. [employer]    Education college.   Right handed.   Caffeine two cup of coffee daily one glass of sweet tea with dinner.    Family History  Problem Relation Age of Onset  . Migraines Mother   . Depression Mother   . Migraines Maternal Grandfather   . Depression Father     BP 120/82   Ht 5\' 7"  (1.702  m)   Wt 195 lb (88.5 kg)   BMI 30.54 kg/m   Review of Systems: See HPI above.     Objective:  Physical Exam:  Gen: Obese lady, No acute distress but slightly uncomfortable due to back pain MSK:  Low back: Inspection: No rash, no vesicle.  No swelling or bruising. Palpation: Tender to palpation at right lumbar paraspinal area and upper of right buttock, right positive SLR, ROM limited in flexion and extension due to pain.  Strength 5-/5 all right lower extremity muscle groups, 5/5 left lower extremity.  Sensation and motor intact.  Mild positive SLR on right.  Bilateral hips: No deformity, instability. FROM with 5/5 strength on left, 5-/5 on right. No tenderness to palpation. NVI distally.  Neurological: Is alert and oriented x 3 Skin: Not diaphoretic. No erythema, no rash   Assessment & Plan:  1. Low back pain, lumbar radiculopathy, likely in setting of small herniated disk.  Hx of L5-S1 small central disc protrusion on old lumbar MRI back to 2016. No red flag. Will manage with home exercise, pain control- PO Prednisone, PRN Tramadol and IM Toradol.  -IM Toradol 60 mg QD -PO Prednisone (Tapering pack: 60 mg x 1 day, then 50 mg x 1 day, then 40 mg x 1 day, then 30 mg x1 day, then 20 mg x 1 day, then 10 mg x 1 day, then 10 mg x 1 day) -PRN Tramadol -Patient to give 2017 a call in a week about her progress -Will consider MRI if no improvement

## 2019-01-12 NOTE — Patient Instructions (Signed)
You have lumbar radiculopathy (a pinched nerve in your low back). Take tylenol for baseline pain relief (1-2 extra strength tabs 3x/day) Take prednisone dose pack as directed. You were given an IM injection of toradol Tramadol as needed for severe pain (no driving on this medicine). Stay as active as possible. Physical therapy has been shown to be helpful as well. Strengthening of low back muscles, abdominal musculature are key for long term pain relief. If not improving, will consider further imaging (MRI). Let me know how you're doing in a week even if you're feeling better.

## 2019-01-13 ENCOUNTER — Encounter: Payer: Self-pay | Admitting: Family Medicine

## 2019-01-16 ENCOUNTER — Other Ambulatory Visit: Payer: Self-pay

## 2019-01-16 ENCOUNTER — Encounter: Payer: Self-pay | Admitting: Emergency Medicine

## 2019-01-16 ENCOUNTER — Ambulatory Visit
Admission: EM | Admit: 2019-01-16 | Discharge: 2019-01-16 | Disposition: A | Discharge status: Home / Self Care | Payer: BC Managed Care – PPO | Attending: Emergency Medicine | Admitting: Emergency Medicine

## 2019-01-16 DIAGNOSIS — M5416 Radiculopathy, lumbar region: Secondary | ICD-10-CM | POA: Diagnosis not present

## 2019-01-16 MED ORDER — HYDROCODONE-ACETAMINOPHEN 5-325 MG PO TABS: 1.0000 | ORAL_TABLET | Freq: Four times a day (QID) | ORAL | 0 refills | Status: DC | PRN

## 2019-01-16 MED ORDER — MELOXICAM 15 MG PO TABS: 15.0000 mg | ORAL_TABLET | Freq: Every day | ORAL | 0 refills | Status: DC

## 2019-01-16 MED ORDER — KETOROLAC TROMETHAMINE 60 MG/2ML IM SOLN
60.0000 mg | Freq: Once | INTRAMUSCULAR | Status: AC
  Administered 2019-01-16: 60 mg via INTRAMUSCULAR

## 2019-01-16 MED ORDER — LIDOCAINE 5 % EX PTCH: 1.0000 | MEDICATED_PATCH | CUTANEOUS | 0 refills | Status: DC

## 2019-01-16 MED ORDER — METAXALONE 800 MG PO TABS: 800.0000 mg | ORAL_TABLET | Freq: Three times a day (TID) | ORAL | 0 refills | Status: DC

## 2019-01-16 NOTE — Discharge Instructions (Addendum)
Limit sitting lifting and bending.  Avoid symptoms as much as possible.  Recommend rest for 2 days with recumbency and gradual short walks.  Dance activities as tolerated.

## 2019-01-16 NOTE — ED Triage Notes (Signed)
Pt c/o lower back pain that radiates down into her right leg and down into her calf. She states this morning she starting having numbness and tingling in her calf. She states that it started about a week ago. She has known herniated disk and she went to see her sports medicine doctor, he gave her tramadol and prednisone pak, does not seem to be working this time. She was also given a Toradol injection while at the doctor office.

## 2019-01-16 NOTE — ED Provider Notes (Signed)
[facility]    CSN: [id number] Arrival date & time: 01/16/19  1447      History   Chief Complaint Chief Complaint  Patient presents with  . Back Pain    HPI Sandra Rocha is a 43 y.o. female.   HPI  43 year old female presents with lower back pain that radiates into her right leg down to the level of her calf.  She has numbness and tingling in the calf.  The actual pain started about a week ago.  I reviewed her medical records from her sports medicine physician.  There, her neuro exam was intact as well as sensation.  She had a mild positive SLR on the right.  She had told physician that she had been helping her mother ambulate after she her mother had back surgery and was exacerbated when she bent over to pick up her 78-year-old.  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  Yesterday she attended a conference but try to move around more often.  She has no bowel or bladder incontinence.  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         Past Medical History:  Diagnosis Date  . Anxiety   . Headache(784.0)   . Low back pain   . Neuropathy     Patient Active Problem List   Diagnosis Date Noted  . Left hip pain 06/28/2015  . Sleep apnea 05/08/2014  . Migraine 12/14/2013  . Low back pain   . Gait disturbance 09/22/2012  . Lumbar radiculopathy 10/08/2009  . Low back pain radiating to left leg 10/08/2009  . WEAKNESS 10/08/2009  . DERMATOPHYTOSIS OF OTHER SPECIFIED SITES 03/08/2008  . ACUTE SINUSITIS, UNSPECIFIED 03/08/2008  . RASH AND OTHER NONSPECIFIC SKIN ERUPTION 03/08/2008  . NECK PAIN 01/19/2008    Past Surgical History:  Procedure Laterality Date  . CESAREAN SECTION      OB History   No obstetric history on file.      Home Medications    Prior to Admission medications   Medication Sig Start Date End Date Taking? Authorizing Provider  gabapentin (NEURONTIN) 600 MG tablet Take 1  tablet (600 mg total) by mouth 3 (three) times daily. 01/12/19 01/12/20 Yes Hudnall, Azucena Fallen, MD  HORIZANT 600 MG TBCR Take 1 tablet by mouth at bedtime. 01/10/19  Yes [provider]  MAGNESIUM ASPARTATE PO Take by mouth daily.   Yes [provider]  Multiple Vitamins-Minerals (MULTIVITAMIN PO) Take by mouth daily.   Yes [provider]  NURTEC 75 MG TBDP  12/27/18  Yes [provider]  PARoxetine (PAXIL) 10 MG tablet Take 10 mg by mouth daily.   Yes [provider]  predniSONE (DELTASONE) 10 MG tablet 6 tabs po day 1, 5 tabs po day 2, 4 tabs po day 3, 3 tabs po day 4, 2 tabs po day 5, 1 tab po day 6 01/12/19  Yes Hudnall, Azucena Fallen, MD  rizatriptan (MAXALT-MLT) 10 MG disintegrating tablet Take 1 tablet (10 mg total) by mouth as needed. May repeat in 2 hours if needed 05/01/15  Yes Levert Feinstein, MD  SUMAtriptan (IMITREX) 100 MG tablet Take 100 mg by mouth as directed. 12/22/18  Yes [provider]  Thiamine HCl (VITAMIN B-1 PO) Take by mouth daily.   Yes [provider]  traMADol (ULTRAM) 50 MG tablet Take 1 tablet (50 mg total) by mouth every  8 (eight) hours as needed. 01/12/19  Yes Hudnall, Azucena Fallen, MD  HYDROcodone-acetaminophen (NORCO/VICODIN) 5-325 MG tablet Take 1-2 tablets by mouth every 6 (six) hours as needed. 01/16/19   Lutricia Feil, PA-C  lidocaine (LIDODERM) 5 % Place 1 patch onto the skin daily. Remove & Discard patch within 12 hours or as directed by MD 01/16/19   Lutricia Feil, PA-C  meloxicam (MOBIC) 15 MG tablet Take 1 tablet (15 mg total) by mouth daily. 01/16/19   Lutricia Feil, PA-C  metaxalone (SKELAXIN) 800 MG tablet Take 1 tablet (800 mg total) by mouth 3 (three) times daily. 01/16/19   Lutricia Feil, PA-C    Family History Family History  Problem Relation Age of Onset  . Migraines Mother   . Depression Mother   . Depression Father   . Migraines Maternal Grandfather     Social History Social  History   Tobacco Use  . Smoking status: Never Smoker  . Smokeless tobacco: Never Used  Substance Use Topics  . Alcohol use: No    Alcohol/week: 0.0 standard drinks  . Drug use: No     Allergies   Patient has no known allergies.   Review of Systems Review of Systems  Constitutional: Positive for activity change. Negative for appetite change, chills, diaphoresis, fatigue and fever.  Musculoskeletal: Positive for back pain and myalgias.  All other systems reviewed and are negative.    Physical Exam Triage Vital Signs ED Triage Vitals  Enc Vitals Group     BP 01/16/19 1503 (!) 139/91     Pulse Rate 01/16/19 1503 (!) 116     Resp 01/16/19 1503 18     Temp 01/16/19 1503 98.4 F (36.9 C)     Temp Source 01/16/19 1503 Oral     SpO2 01/16/19 1503 100 %     Weight 01/16/19 1459 195 lb (88.5 kg)     Height 01/16/19 1459 5\' 7"  (1.702 m)     Head Circumference --      Peak Flow --      Pain Score 01/16/19 1459 10     Pain Loc --      Pain Edu? --      Excl. in GC? --    No data found.  Updated Vital Signs BP (!) 139/91 (BP Location: Left Arm)   Pulse (!) 116   Temp 98.4 F (36.9 C) (Oral)   Resp 18   Ht 5\' 7"  (1.702 m)   Wt 195 lb (88.5 kg)   SpO2 100%   BMI 30.54 kg/m   Visual Acuity Right Eye Distance:   Left Eye Distance:   Bilateral Distance:    Right Eye Near:   Left Eye Near:    Bilateral Near:     Physical Exam Vitals signs and nursing note reviewed. Exam conducted with a chaperone present.  Constitutional:      General: She is not in acute distress.    Appearance: Normal appearance. She is obese. She is not ill-appearing or toxic-appearing.  HENT:     Head: Normocephalic and atraumatic.  Eyes:     Conjunctiva/sclera: Conjunctivae normal.  Cardiovascular:     Rate and Rhythm: Normal rate and regular rhythm.     Heart sounds: Normal heart sounds.  Pulmonary:     Effort: Pulmonary effort is normal.     Breath sounds: Normal breath sounds.   Musculoskeletal:        General: Tenderness present.  Comments: Examination performed with [place] CMA as chaperone and attendant.  She has a level pelvis in stance.  She does have flattening of lordotic curve.  Actually tender over the right sacroiliac joint particularly inferiorly.  She is able to forward flex with her hands to the level of the mid thigh.  Straightening is difficult.  Lateral flexion is full but with complaints of discomfort right work.  She is able to toe and heel walk.  Sensation shows a hip esthesia to light touch of the right lateral calf and dorsum of the foot.  DTRs are hyperreflexic bilaterally but symmetrical at 3/4.  Straight leg raise testing is negative on the left and positive at the right in the seated position at 90 degrees with right leg radiation and pain over the sacroiliac joint.  Skin:    General: Skin is warm and dry.  Neurological:     General: No focal deficit present.     Mental Status: She is alert and oriented to person, place, and time.  Psychiatric:        Mood and Affect: Mood normal.        Behavior: Behavior normal.        Thought Content: Thought content normal.        Judgment: Judgment normal.      UC Treatments / Results  Labs (all labs ordered are listed, but only abnormal results are displayed) Labs Reviewed - No data to display  EKG   Radiology No results found.  Procedures Procedures (including critical care time)  Medications Ordered in UC Medications  ketorolac (TORADOL) injection 60 mg (60 mg Intramuscular Given 01/16/19 1541)    Initial Impression / Assessment and Plan / UC Course  I have reviewed the triage vital signs and the nursing notes.  Pertinent labs & imaging results that were available during my care of the patient were reviewed by me and considered in my medical decision making (see chart for details).   I had a long discussion with the patient regarding her findings.  Is obvious that she is not  protecting her back adequately and allowing it to heal.  Unfortunately her job as a Engineer, maintenance (IT) requires prolonged sitting.  Also has a mother with of a 71-year-old it is easy to neglect her own health.  I have impressed upon her the need for protection of her back by avoiding symptoms as much as possible.  She needs to limit prolonged sitting lifting and bending.  At this time her exam shows a decrease in sensation.  Luckily motors are still intact.  Her sacroiliac joint is very tender to palpation.  We will start her on Toradol 60 mg IM today.  She will finish her prednisone tomorrow and begin using Mobic at that time.  Have given her prescription for lidocaine patch that she will use on a daily basis as necessary.  Continue to use ice as necessary 20 minutes out of every 2 hours 4-5 times daily.  Would like her to not work for the next 2 days and to stay recumbent as much as possible but also walking intermittently for short periods.  We will provide her with Skelaxin to help at nighttime.  I have also given her a few Vicodin for her severe pain that she is in at the present time.  Review of PCP shows that she does use pain medications liberally.  Had several prescriptions of Toradol recently and has had oxycodone in the past.  I have impressed  upon her again that this is not a road that she wants to take.  If she is not improving in a short period of time she should return to her sports medicine physician for decisions on epidural steroid injections or MRIs for further evaluation.   Final Clinical Impressions(s) / UC Diagnoses   Final diagnoses:  Acute right lumbar radiculopathy     Discharge Instructions     Limit sitting lifting and bending.  Avoid symptoms as much as possible.  Recommend rest for 2 days with recumbency and gradual short walks.  Dance activities as tolerated.    ED Prescriptions    Medication Sig Dispense Auth. Provider   lidocaine (LIDODERM) 5 % Place 1 patch onto the skin daily.  Remove & Discard patch within 12 hours or as directed by MD 30 patch Lutricia Feiloemer, Zykia Walla P, PA-C   metaxalone (SKELAXIN) 800 MG tablet Take 1 tablet (800 mg total) by mouth 3 (three) times daily. 21 tablet Ovid Curdoemer, Candler Ginsberg P, PA-C   HYDROcodone-acetaminophen (NORCO/VICODIN) 5-325 MG tablet Take 1-2 tablets by mouth every 6 (six) hours as needed. 10 tablet Ovid Curdoemer, Ladarien Beeks P, PA-C   meloxicam (MOBIC) 15 MG tablet Take 1 tablet (15 mg total) by mouth daily. 30 tablet Lutricia Feiloemer, Fia Hebert P, PA-C     I have reviewed the PDMP during this encounter.   Lutricia FeilRoemer, Mikisha Roseland P, PA-C 01/16/19 1607

## 2019-01-19 ENCOUNTER — Telehealth: Payer: Self-pay | Admitting: Family Medicine

## 2019-01-19 NOTE — Telephone Encounter (Signed)
-----   Message from [facility], LAT sent at 01/19/2019 11:23 AM EST ----- Regarding: FW: phone message  ----- Message ----- From: Carolyne Littles Sent: 01/19/2019  11:16 AM EST To: Jolinda Croak, LAT Subject: phone message                                  Pt called with update she had a rough week went to UC on Sunday in [place].  Still in a lot of pain on right side. Numbness in rt foot and calf.

## 2019-01-19 NOTE — Telephone Encounter (Signed)
If she's not improving with the prednisone, toradol, currently on meloxicam, home exercises - given the severity of her pain would recommend going ahead with an MRI of her lumbar spine - please call her and ask if she'd like to move forward with this.  Then ok to get approval - possible she'll need repeat lumbar spine radiographs prior to this.  See if she can get it within the next few days somewhere after insurance approval if possible.  Continue with medications prescribed by urgent care in meantime.

## 2019-01-20 ENCOUNTER — Other Ambulatory Visit: Payer: Self-pay

## 2019-01-20 DIAGNOSIS — M5416 Radiculopathy, lumbar region: Secondary | ICD-10-CM

## 2019-01-20 MED ORDER — HYDROCODONE-ACETAMINOPHEN 5-325 MG PO TABS: 1.0000 | ORAL_TABLET | Freq: Four times a day (QID) | ORAL | 0 refills | Status: DC | PRN

## 2019-01-20 NOTE — Telephone Encounter (Signed)
Staff contacted patient.  Patient will be getting lumbar spine MRI.  Will send in 20 tabs of norco after having reviewed database.

## 2019-01-22 ENCOUNTER — Other Ambulatory Visit: Payer: Self-pay

## 2019-01-22 ENCOUNTER — Ambulatory Visit (HOSPITAL_BASED_OUTPATIENT_CLINIC_OR_DEPARTMENT_OTHER)
Admission: RE | Admit: 2019-01-22 | Discharge: 2019-01-22 | Disposition: A | Discharge status: Home / Self Care | Payer: BC Managed Care – PPO | Source: Ambulatory Visit | Attending: Family Medicine | Admitting: Family Medicine

## 2019-01-22 DIAGNOSIS — M5416 Radiculopathy, lumbar region: Secondary | ICD-10-CM

## 2019-01-31 ENCOUNTER — Other Ambulatory Visit: Payer: Self-pay

## 2019-01-31 ENCOUNTER — Telehealth: Payer: Self-pay

## 2019-01-31 DIAGNOSIS — M5416 Radiculopathy, lumbar region: Secondary | ICD-10-CM

## 2019-01-31 MED ORDER — GABAPENTIN 300 MG PO CAPS: 300.0000 mg | ORAL_CAPSULE | Freq: Three times a day (TID) | ORAL | 0 refills | Status: DC

## 2019-01-31 NOTE — Addendum Note (Signed)
Addended by: Jolinda Croak E on: 01/31/2019 02:48 PM   Modules accepted: Orders

## 2019-01-31 NOTE — Telephone Encounter (Signed)
Pt said the [facility] in [place] on [address] is the closest to her house. She also gave an update on how she's feeling. She has seen slight improvement but is still in pain. Asking for an Rx for 300 mg gabapentin because cutting the 600 mg is becoming a hassle. Rx sent in so pt can continue taking 900 mg TID. Pt will call us in a few weeks to let us know how she's doing and how PT is going.

## 2019-02-11 ENCOUNTER — Other Ambulatory Visit: Payer: Self-pay

## 2019-02-11 MED ORDER — GABAPENTIN 600 MG PO TABS: 600.0000 mg | ORAL_TABLET | Freq: Three times a day (TID) | ORAL | 2 refills | Status: DC

## 2019-02-21 ENCOUNTER — Telehealth: Payer: Self-pay

## 2019-02-21 MED ORDER — GABAPENTIN 300 MG PO CAPS: 300.0000 mg | ORAL_CAPSULE | Freq: Three times a day (TID) | ORAL | 2 refills | Status: DC

## 2019-02-21 NOTE — Telephone Encounter (Signed)
Ok to go ahead and refill.  Thanks!

## 2019-04-08 ENCOUNTER — Telehealth: Payer: Self-pay

## 2019-04-08 MED ORDER — GABAPENTIN 600 MG PO TABS: 600.0000 mg | ORAL_TABLET | Freq: Three times a day (TID) | ORAL | 2 refills | Status: DC

## 2019-04-08 NOTE — Telephone Encounter (Signed)
Refill sent to pharmacy.   

## 2019-04-27 ENCOUNTER — Telehealth: Payer: Self-pay

## 2019-04-27 MED ORDER — GABAPENTIN 300 MG PO CAPS: 300.0000 mg | ORAL_CAPSULE | Freq: Three times a day (TID) | ORAL | 2 refills | Status: DC

## 2019-04-27 NOTE — Telephone Encounter (Signed)
Refill sent to pharmacy.   

## 2019-06-24 ENCOUNTER — Other Ambulatory Visit: Payer: Self-pay

## 2019-06-24 MED ORDER — GABAPENTIN 600 MG PO TABS: 600.0000 mg | ORAL_TABLET | Freq: Three times a day (TID) | ORAL | 2 refills | Status: DC

## 2019-06-24 NOTE — Progress Notes (Signed)
Pt called requesting refill on gabapentin 600 mg. States she would like to pick it up today as she is going out of town. Refill sent to pharmacy.

## 2019-06-29 ENCOUNTER — Ambulatory Visit: Payer: BC Managed Care – PPO | Admitting: Family Medicine

## 2019-07-11 ENCOUNTER — Ambulatory Visit: Payer: BC Managed Care – PPO | Admitting: Family Medicine

## 2019-07-11 ENCOUNTER — Encounter: Payer: Self-pay | Admitting: Family Medicine

## 2019-07-11 ENCOUNTER — Other Ambulatory Visit: Payer: Self-pay

## 2019-07-11 VITALS — BP 144/92 | Ht 67.0 in | Wt 220.0 lb

## 2019-07-11 DIAGNOSIS — M25561 Pain in right knee: Secondary | ICD-10-CM | POA: Diagnosis not present

## 2019-07-11 MED ORDER — GABAPENTIN 300 MG PO CAPS: 300.0000 mg | ORAL_CAPSULE | Freq: Three times a day (TID) | ORAL | 2 refills | Status: DC

## 2019-07-11 NOTE — Patient Instructions (Signed)
Your exam and ultrasound are reassuring. You have a knee contusion with a small residual hematoma. Icing 15 minutes at a time 3-4 times a day. Activities as tolerated. Aleve, gabapentin as you have been. Consider a kneepad if you're doing activities that require kneeling. These can take several weeks to a few months to resolve. Follow up with me as needed.

## 2019-07-11 NOTE — Progress Notes (Signed)
PCP: Shaune Pollack, MD (Inactive)  Subjective:   HPI: Patient is a 44 y.o. female here for right knee pain.  Patient reports about 5-6 weeks ago she was in the driveway, lost her balance and fell forward. Landed directly onto her right knee and elbow. Elbow improved but knee is still bothering her. Pain anterior below the knee. Still with some bruising in the area. Worse with kneeling. No prior injuries. Taken aleve, gabapentin. Has an area where it feels like something is moving or is loose below the knee. No catching, locking.  Past Medical History:  Diagnosis Date  . Anxiety   . Headache(784.0)   . Low back pain   . Neuropathy     Current Outpatient Medications on File Prior to Visit  Medication Sig Dispense Refill  . hydrOXYzine (ATARAX/VISTARIL) 25 MG tablet Take 1/2 - 1 tab po tid prn    . norethindrone-ethinyl estradiol-iron (LOESTRIN FE) 1.5-30 MG-MCG tablet Take one active pill daily, skip placebo pills, for menstrual suppression    . almotriptan (AXERT) 12.5 MG tablet     . DULoxetine (CYMBALTA) 30 MG capsule Take 30 mg by mouth 2 (two) times daily.    Marland Kitchen gabapentin (NEURONTIN) 600 MG tablet Take 1 tablet (600 mg total) by mouth 3 (three) times daily. 90 tablet 2  . HORIZANT 600 MG TBCR Take 1 tablet by mouth at bedtime.    . lidocaine (LIDODERM) 5 % Place 1 patch onto the skin daily. Remove & Discard patch within 12 hours or as directed by MD 30 patch 0  . MAGNESIUM ASPARTATE PO Take by mouth daily.    . meloxicam (MOBIC) 15 MG tablet Take 1 tablet (15 mg total) by mouth daily. 30 tablet 0  . Multiple Vitamins-Minerals (MULTIVITAMIN PO) Take by mouth daily.    . NURTEC 75 MG TBDP     . PARoxetine (PAXIL) 10 MG tablet Take 10 mg by mouth daily.    . rizatriptan (MAXALT-MLT) 10 MG disintegrating tablet Take 1 tablet (10 mg total) by mouth as needed. May repeat in 2 hours if needed 10 tablet 6  . SUMAtriptan (IMITREX) 100 MG tablet Take 100 mg by mouth as directed.     . Thiamine HCl (VITAMIN B-1 PO) Take by mouth daily.    . traMADol (ULTRAM) 50 MG tablet Take 1 tablet (50 mg total) by mouth every 8 (eight) hours as needed. 30 tablet 0   No current facility-administered medications on file prior to visit.    Past Surgical History:  Procedure Laterality Date  . CESAREAN SECTION      No Known Allergies  Social History   Socioeconomic History  . Marital status: Married    Spouse name: [name]  . Number of children: 2  . Years of education: college  . Highest education level: Not on file  Occupational History    Employer: [employer]  Tobacco Use  . Smoking status: Never Smoker  . Smokeless tobacco: Never Used  Substance and Sexual Activity  . Alcohol use: No    Alcohol/week: 0.0 standard drinks  . Drug use: No  . Sexual activity: Yes    Birth control/protection: Pill  Other Topics Concern  . Not on file  Social History Narrative   Patient lives at home with her husband [name])   Patient  Works full time IT trainer. CFO    Education college.   Right handed.   Caffeine two cup of coffee daily one glass of sweet tea with  dinner.   Social Determinants of Health   Financial Resource Strain:   . Difficulty of Paying Living Expenses:   Food Insecurity:   . Worried About Charity fundraiser in the Last Year:   . Arboriculturist in the Last Year:   Transportation Needs:   . Film/video editor (Medical):   Marland Kitchen Lack of Transportation (Non-Medical):   Physical Activity:   . Days of Exercise per Week:   . Minutes of Exercise per Session:   Stress:   . Feeling of Stress :   Social Connections:   . Frequency of Communication with Friends and Family:   . Frequency of Social Gatherings with Friends and Family:   . Attends Religious Services:   . Active Member of Clubs or Organizations:   . Attends Archivist Meetings:   Marland Kitchen Marital Status:   Intimate Partner Violence:   . Fear of Current or Ex-Partner:   .  Emotionally Abused:   Marland Kitchen Physically Abused:   . Sexually Abused:     Family History  Problem Relation Age of Onset  . Migraines Mother   . Depression Mother   . Depression Father   . Migraines Maternal Grandfather     BP (!) 144/92   Ht 5\' 7"  (1.702 m)   Wt 220 lb (99.8 kg)   BMI 34.46 kg/m   Review of Systems: See HPI above.     Objective:  Physical Exam:  Gen: NAD, comfortable in exam room  Right knee: Hyperpigmentation below patellar tendon.  No other gross deformity, ecchymoses, effusion. Small mobile mass just lateral to patellar tendon, minimally tender. TTP medial joint line, tibial tubercle, minimally post patellar facets. No other tenderness.Marland Kitchen FROM with normal strength. Negative ant/post drawers. Negative valgus/varus testing. Negative lachmans. Negative mcmurrays, apleys, patellar apprehension, thessalys. NV intact distally.   Limited MSK u/s right knee: No effusion.  Patellar tendon normal.  No infrapatellar bursitis.  Small mobile hematoma noted.  Assessment & Plan:  1. Right knee pain - exam and ultrasound reassuring.  Consistent with knee contusion with small residual hematoma.  Icing, aleve, gabapentin.  Kneepad if kneeling.  Activities as tolerated.  F/u prn.

## 2019-09-02 ENCOUNTER — Other Ambulatory Visit: Payer: Self-pay | Admitting: Family Medicine

## 2019-09-02 ENCOUNTER — Other Ambulatory Visit: Payer: Self-pay

## 2019-09-02 MED ORDER — GABAPENTIN 600 MG PO TABS: ORAL_TABLET | ORAL | 1 refills | Status: DC

## 2019-09-02 NOTE — Progress Notes (Signed)
Pt called requesting refill. States she takes 900 mg 3 times day but will sometimes take an additional 600 mg tab if needed. Discussed with pt that we will d/c her 300 mg gabapentin and refill her 600 mg gabapentin so she can take up to 2 tabs 3 times daily prn. Pt understands and agrees with the plan. Refill sent to pharmacy.

## 2019-10-19 ENCOUNTER — Other Ambulatory Visit: Payer: Self-pay | Admitting: Family Medicine

## 2019-10-31 ENCOUNTER — Other Ambulatory Visit: Payer: Self-pay

## 2019-10-31 ENCOUNTER — Other Ambulatory Visit: Payer: Self-pay | Admitting: Family Medicine

## 2019-10-31 MED ORDER — GABAPENTIN 600 MG PO TABS: ORAL_TABLET | ORAL | 0 refills | Status: DC

## 2019-10-31 NOTE — Progress Notes (Signed)
Rx sent in on 10/19/19 was cancelled by provider. New Rx sent in today. Spoke with pharmacy to verify.

## 2019-11-28 ENCOUNTER — Other Ambulatory Visit: Payer: Self-pay | Admitting: Family Medicine

## 2019-11-28 ENCOUNTER — Other Ambulatory Visit: Payer: Self-pay

## 2019-11-28 MED ORDER — GABAPENTIN 600 MG PO TABS: ORAL_TABLET | ORAL | 0 refills | Status: DC

## 2019-11-28 NOTE — Progress Notes (Signed)
Pt called requesting refill

## 2019-12-26 ENCOUNTER — Other Ambulatory Visit: Payer: Self-pay | Admitting: Family Medicine

## 2020-01-23 ENCOUNTER — Other Ambulatory Visit: Payer: Self-pay | Admitting: Family Medicine

## 2020-01-23 ENCOUNTER — Other Ambulatory Visit: Payer: Self-pay

## 2020-01-23 MED ORDER — GABAPENTIN 600 MG PO TABS: ORAL_TABLET | ORAL | 0 refills | Status: DC

## 2020-01-31 ENCOUNTER — Other Ambulatory Visit: Payer: Self-pay

## 2020-01-31 ENCOUNTER — Ambulatory Visit: Payer: BC Managed Care – PPO | Admitting: Sports Medicine

## 2020-01-31 DIAGNOSIS — M5416 Radiculopathy, lumbar region: Secondary | ICD-10-CM

## 2020-01-31 MED ORDER — TRAMADOL HCL 50 MG PO TABS: 50.0000 mg | ORAL_TABLET | Freq: Three times a day (TID) | ORAL | 1 refills | Status: DC | PRN

## 2020-01-31 NOTE — Progress Notes (Signed)
Chief complaint low back pain  Patient has had problems with lumbar radiculopathy and degenerative lower lumbar disc disease for many years She has responded well to fairly high-dose gabapentin She does a series of flexion exercises The day after Thanksgiving she went hiking She was going up and down hills on some irregular ground That evening she had severe low back pain and had to go to bed Pain continues to radiate more into her right hip but not down either leg This is similar to flare she has had in the past Her last significant flare was November 2020 In reviewing she has had only limited response to prednisone and it tends to keep her awake She usually gets good pain relief with 1 tramadol 3 times a day but has not taken any in the past year because of no significant flares  Review of systems No bowel or bladder symptoms No sciatic type symptoms Right leg feels a bit weaker but primarily because of pain  Physical exam Pleasant female in no acute distress BP (!) 127/51   Ht 5\' 7"  (1.702 m)   Wt 220 lb (99.8 kg)   BMI 34.46 kg/m   Patient has normal reflexes at knees and ankles Straight leg raise is positive on the right but just above 70 degrees She can do heel toe and tandem walk She gets pain at 30 degrees of flexion and 10 degrees of back extension Lateral bending is not painful Rotation to the left hurts in her right buttocks Strength overall appears normal

## 2020-01-31 NOTE — Assessment & Plan Note (Signed)
1 of multiple flares of low back pain but she has generally been doing better with the last flare 1 year ago  We will continue on gabapentin and add tramadol again  See the instructions for her to resume activity and return for recheck in 3 weeks if not improved

## 2020-01-31 NOTE — Patient Instructions (Signed)
You have flared your degenerative lumbar disc issues again  Go back on tramadol - 1 or 2 3 times per day until pain is less Watch constipation You can stop when pain is back to baseline  Keep up gabapentin at same dose  Alternate heat and ice to see which helps  Easy short walks   Easy motion exercises  When possible restart flexion exercises  See me if not better in 3 weeks

## 2020-02-01 ENCOUNTER — Ambulatory Visit: Payer: BC Managed Care – PPO | Admitting: Family Medicine

## 2020-02-17 ENCOUNTER — Other Ambulatory Visit: Payer: Self-pay

## 2020-02-17 MED ORDER — GABAPENTIN 600 MG PO TABS: ORAL_TABLET | ORAL | 0 refills | Status: DC

## 2020-02-17 NOTE — Telephone Encounter (Signed)
Ok to refill 

## 2020-03-16 ENCOUNTER — Other Ambulatory Visit: Payer: Self-pay

## 2020-03-16 ENCOUNTER — Other Ambulatory Visit: Payer: Self-pay | Admitting: Family Medicine

## 2020-04-16 ENCOUNTER — Other Ambulatory Visit: Payer: Self-pay

## 2020-04-16 MED ORDER — GABAPENTIN 600 MG PO TABS: ORAL_TABLET | ORAL | 2 refills | Status: DC

## 2020-07-03 ENCOUNTER — Other Ambulatory Visit: Payer: Self-pay | Admitting: Sports Medicine

## 2020-07-03 MED ORDER — TRAMADOL HCL 50 MG PO TABS: 50.0000 mg | ORAL_TABLET | Freq: Three times a day (TID) | ORAL | 1 refills | Status: DC | PRN

## 2020-07-05 ENCOUNTER — Ambulatory Visit: Payer: BC Managed Care – PPO | Admitting: Sports Medicine

## 2020-07-05 ENCOUNTER — Other Ambulatory Visit: Payer: Self-pay

## 2020-07-05 DIAGNOSIS — M5416 Radiculopathy, lumbar region: Secondary | ICD-10-CM | POA: Diagnosis not present

## 2020-07-05 MED ORDER — GABAPENTIN 600 MG PO TABS: ORAL_TABLET | ORAL | 2 refills | Status: DC

## 2020-07-05 MED ORDER — MELOXICAM 15 MG PO TABS: 15.0000 mg | ORAL_TABLET | Freq: Every day | ORAL | 3 refills | Status: DC

## 2020-07-05 NOTE — Assessment & Plan Note (Signed)
One of frequent flares Pain less with tramadol Add meloxicam and limit tramadol Cont, gabapentin  Resume back exercise regimen

## 2020-07-05 NOTE — Patient Instructions (Signed)
Keep up the gabapentin at 2 x 600 mgm three times a day Add meloxicam 15 mg once daily for 1 week or until back feels like normal Tramadol as needed for more severe pain  Caution with dragging right foot and tripping  Do toe walking to help rebuild the strength As tolerated do 2 to 3 short walks a day while healing

## 2020-07-05 NOTE — Progress Notes (Signed)
CC: Low Back Pain  Chronic LBP Under control until last week Picked up dog and severe pain into RT leg In past more pain to left leg In bed 2 days and past 3 days getting better after I called some tramadol Took an extra gabapentin Already on 1200 tid  Still with tingling on RT leg Feels like foot drags  Ros No bowel or bladder issues No cough pain  PE Pleasant F in NAD BP 124/84   Ht 5\' 7"  (1.702 m)   Wt 225 lb (102.1 kg)   BMI 35.24 kg/m   Mild weakness in RT foot on toe walking She can do heel and toe walk Direct testing shows 5/5 strength left foot RT foot 4.5/5 on PF but normal otherwise  SLR causes pain into RT leg DTRs preserved

## 2020-08-30 ENCOUNTER — Other Ambulatory Visit: Payer: Self-pay | Admitting: Sports Medicine

## 2020-08-30 MED ORDER — TRAMADOL HCL 50 MG PO TABS: 50.0000 mg | ORAL_TABLET | Freq: Three times a day (TID) | ORAL | 3 refills | Status: DC | PRN

## 2020-09-21 ENCOUNTER — Other Ambulatory Visit: Payer: Self-pay

## 2020-09-21 ENCOUNTER — Ambulatory Visit (INDEPENDENT_AMBULATORY_CARE_PROVIDER_SITE_OTHER): Payer: BC Managed Care – PPO | Admitting: Family Medicine

## 2020-09-21 ENCOUNTER — Encounter: Payer: Self-pay | Admitting: Family Medicine

## 2020-09-21 VITALS — BP 137/99 | Ht 67.0 in | Wt 225.0 lb

## 2020-09-21 DIAGNOSIS — M62838 Other muscle spasm: Secondary | ICD-10-CM

## 2020-09-21 MED ORDER — CYCLOBENZAPRINE HCL 10 MG PO TABS: 10.0000 mg | ORAL_TABLET | Freq: Every evening | ORAL | 0 refills | Status: DC | PRN

## 2020-09-21 MED ORDER — TRAMADOL HCL 50 MG PO TABS: 100.0000 mg | ORAL_TABLET | Freq: Three times a day (TID) | ORAL | 0 refills | Status: DC | PRN

## 2020-09-21 NOTE — Progress Notes (Signed)
  Sandra Rocha - 45 y.o. female MRN [id number]  Date of birth: May 17, 1975    SUBJECTIVE:      Chief Complaint:/ HPI:  Acute onset left-sided neck pain and stiffness.  Started yesterday and got worse throughout the day.  She had a lot of difficulty sleeping last night because of the pain this morning the pain is intense and she cannot look to the left.  Has had similar but not as severe problem many years ago in her left neck when she was in graduate school.  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  No arm numbness or tingling.  No visual changes.  No rash.  Pain radiates into the posterior left shoulder and is most severe in the posterior left neck.    OBJECTIVE: BP (!) 137/99   Ht 5\' 7"  (1.702 m)   Wt 225 lb (102.1 kg)   BMI 35.24 kg/m   Physical Exam:  Vital signs are reviewed. GENERAL: Well-developed female no acute distress NECK: Limited lateral rotation to the left by about 5 degrees secondary to spasm.  She can rotate laterally on the right 90 degrees.  Tender to palpation with a lot of muscle spasm noticed in the left-sided posterior neck muscles in the left trapezius.  There are several trigger points noted in the left trapezius. Shoulders bilateral are symmetrical and have full range of motion and normal strength in all planes the rotator cuff. Intact sensation bilateral hands to soft touch.  ASSESSMENT & PLAN: #1.  Acute neck muscle spasm.  Likely due to increased workload on the computer.  No red flags.  She tried 1 tramadol and a tizanidine muscle relaxer last night without much help.  We will give her a short prescription for increased dose of tramadol and change her to Flexeril.  Precautions given about not operating motor vehicle etc. while under influence.  She has a few days planned at great [facility] is a get away and she is not planning on driving.  I if she is not improving or if she has worsening symptoms she  will contact [facility].  Otherwise she will follow-up as needed. See problem based charting & AVS for pt instructions. No problem-specific Assessment & Plan notes found for this encounter.

## 2020-10-02 ENCOUNTER — Other Ambulatory Visit: Payer: Self-pay | Admitting: *Deleted

## 2020-10-02 MED ORDER — GABAPENTIN 600 MG PO TABS: ORAL_TABLET | ORAL | 2 refills | Status: DC

## 2020-11-27 ENCOUNTER — Other Ambulatory Visit: Payer: Self-pay

## 2020-11-29 ENCOUNTER — Other Ambulatory Visit: Payer: Self-pay | Admitting: Sports Medicine

## 2020-11-29 MED ORDER — TRAMADOL HCL 50 MG PO TABS: 100.0000 mg | ORAL_TABLET | Freq: Three times a day (TID) | ORAL | 3 refills | Status: DC | PRN

## 2020-12-31 ENCOUNTER — Other Ambulatory Visit: Payer: Self-pay

## 2020-12-31 MED ORDER — GABAPENTIN 600 MG PO TABS: ORAL_TABLET | ORAL | 2 refills | Status: DC

## 2021-01-29 ENCOUNTER — Ambulatory Visit: Payer: BC Managed Care – PPO | Admitting: Family Medicine

## 2021-01-29 ENCOUNTER — Encounter: Payer: Self-pay | Admitting: Family Medicine

## 2021-01-29 VITALS — BP 130/90 | Ht 67.0 in | Wt 225.0 lb

## 2021-01-29 DIAGNOSIS — M545 Low back pain, unspecified: Secondary | ICD-10-CM | POA: Diagnosis not present

## 2021-01-29 DIAGNOSIS — G8929 Other chronic pain: Secondary | ICD-10-CM | POA: Diagnosis not present

## 2021-01-29 DIAGNOSIS — M542 Cervicalgia: Secondary | ICD-10-CM | POA: Diagnosis not present

## 2021-01-29 MED ORDER — TRAMADOL HCL 50 MG PO TABS: 100.0000 mg | ORAL_TABLET | Freq: Three times a day (TID) | ORAL | 3 refills | Status: DC | PRN

## 2021-01-29 MED ORDER — MELOXICAM 15 MG PO TABS: 15.0000 mg | ORAL_TABLET | Freq: Every day | ORAL | 3 refills | Status: DC

## 2021-01-29 MED ORDER — CYCLOBENZAPRINE HCL 10 MG PO TABS: 10.0000 mg | ORAL_TABLET | Freq: Every evening | ORAL | 0 refills | Status: DC | PRN

## 2021-01-29 NOTE — Progress Notes (Signed)
  Sandra Rocha - 45 y.o. female MRN [id number]  Date of birth: 06-Jun-1975    SUBJECTIVE:      Chief Complaint:/ HPI:  Recurrence of neck pain.  Started several days ago and has not gone away.  Cannot rotate her head to the left and she is got pain and spasm in the left neck muscles in the left shoulder muscle.  Was doing quite a bit of heavy lifting over the holiday and one of her kids was sick and sleeping in the bed with her so she slept a little bit differently than usual.  This is the only thing she can think of that really made it flare.  This is the third episode she has had since July.  Symptoms are similar, muscle spasm, pain, stiffness.  No upper extremity numbness tingling or weakness on the left.  No associated headache.    OBJECTIVE: BP 130/90   Ht 5\' 7"  (1.702 m)   Wt 225 lb (102.1 kg)   BMI 35.24 kg/m   Physical Exam:  Vital signs are reviewed. GENERAL: Well-developed female no acute distress NECK: Supple: No lymphadenopathy, no thyromegaly.  Significant muscle spasm in the left trapezius and the left sternocleidomastoid with some tenderness to palpation in the pectoralis muscle proximally.  There is really no tenderness to palpation of the rhomboid or levator scapula. NEURO: Intact sensation to soft touch and normal grip strength bilateral hands. Skin: Area of the left neck reveals no lesions, no rash, no unusual erythema or warmth.  ASSESSMENT & PLAN:  See problem based charting & AVS for pt instructions. NECK PAIN See AVS for details of home exercise program.  I would recommend she do that as often as she could.  For right now, and acute spasm we will refill her tramadol and cyclobenzaprine.  She has continued to use gabapentin and I think that is fine to continue.  We will also add couple of weeks of meloxicam.  She will return if not improving.

## 2021-01-29 NOTE — Assessment & Plan Note (Signed)
See AVS for details of home exercise program.  I would recommend she do that as often as she could.  For right now, and acute spasm we will refill her tramadol and cyclobenzaprine.  She has continued to use gabapentin and I think that is fine to continue.  We will also add couple of weeks of meloxicam.  She will return if not improving.

## 2021-01-29 NOTE — Assessment & Plan Note (Deleted)
See AVS for details of home exercise program.  I would recommend she do that as often as she could.  For right now, and acute spasm we will refill her tramadol and cyclobenzaprine.  She has continued to use gabapentin and I think that is fine to continue.  We will also add couple of weeks of meloxicam.  She will return if not improving.

## 2021-01-29 NOTE — Patient Instructions (Signed)
I am sending in refills for the medications we talked about.    I am also going to give you a handout on the 3 exercises that I think would be most beneficial for you.  In the ideal world, I would have you do each of them once a day.  In a similar ideal world, I would have you do them 2 or 3 times a week.  At the very least, they can be helpful when you feel this coming on or if you are already in the muscle spasm, you can do them to gently ease her self out of a spasm.  As we discussed, you are certainly in the midst of a very severe neck muscle spasm now.  I am not concerned that there is any abnormal neck anatomy going on but if you continue to have these at this rate, 3 per 6 months, we might consider sending you to formal physical therapy and I would probably just get some baseline x-rays.  If you decide you want to do that, let me know.  Otherwise, we are always happy to see you.  Have a high happy holiday season!

## 2021-02-01 ENCOUNTER — Ambulatory Visit: Payer: BC Managed Care – PPO | Admitting: Family Medicine

## 2021-02-12 IMAGING — MR MR LUMBAR SPINE W/O CM
5 series · 48 of 48 positions shown · non-contrast
Comparison: Lumbar MRI 06/19/2014

CLINICAL DATA: Lumbar radiculopathy. Right leg pain and weakness
with numbness.

EXAM:
MRI LUMBAR SPINE WITHOUT CONTRAST
TECHNIQUE: Multiplanar, multisequence MR imaging of the lumbar spine was
performed. No intravenous contrast was administered.

[Series 2: T2 · sagittal · 4.0mm · 1.02mm/px · 6 of 15 slices shown (1 of 2)]
[im 1/15]
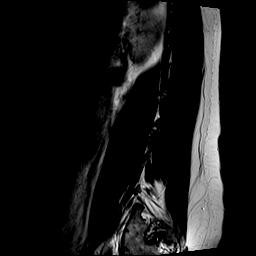
[im 3/15]
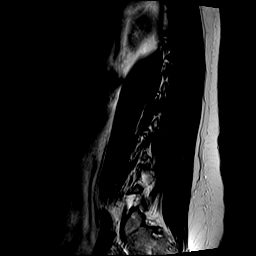
[im 6/15]
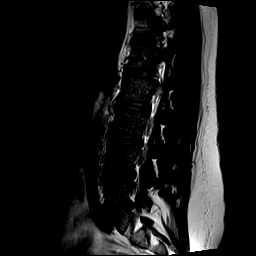
[im 9/15]
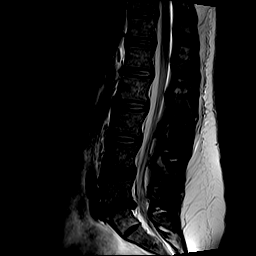
[im 12/15]
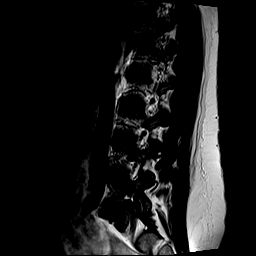
[im 15/15]
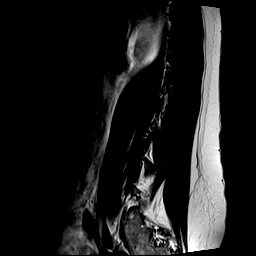

[Series 3: STIR · sagittal · 4.0mm · 1.02mm/px · 7 of 15 slices shown]
[im 1/15]
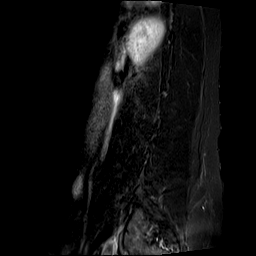
[im 3/15]
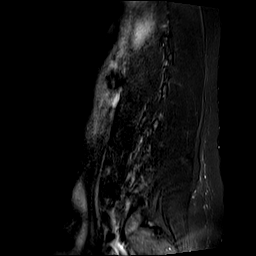
[im 5/15]
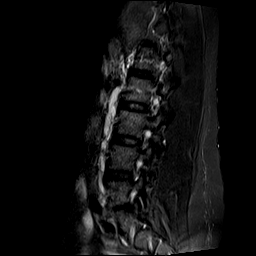
[im 8/15]
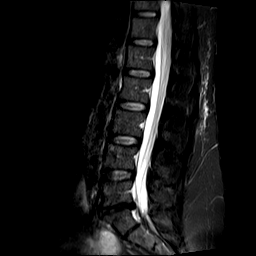
[im 10/15]
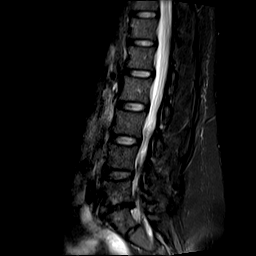
[im 12/15]
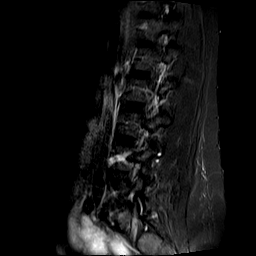
[im 15/15]
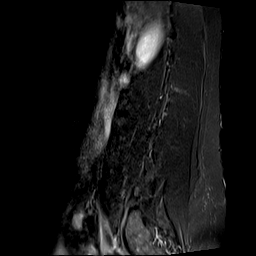

[Series 4: T1 · sagittal · 4.0mm · 1.02mm/px · 7 of 15 slices shown (1 of 2)]
[im 1/15]
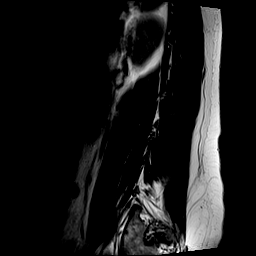
[im 3/15]
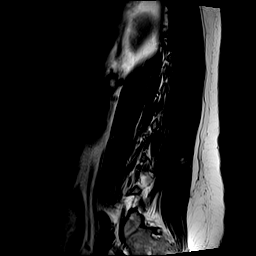
[im 5/15]
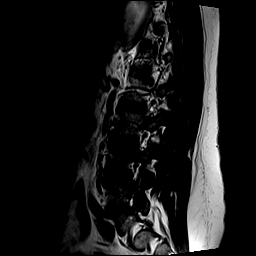
[im 8/15]
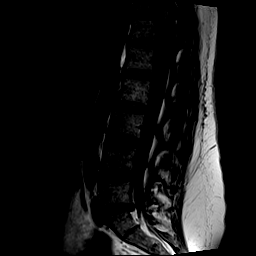
[im 10/15]
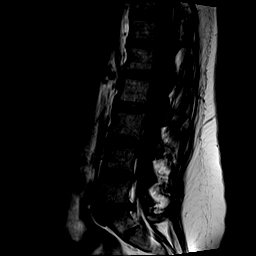
[im 12/15]
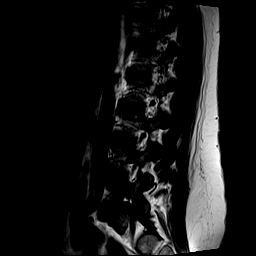
[im 15/15]
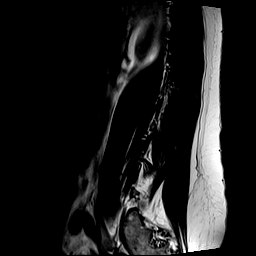

[Series 5: T2 · axial · 4.0mm · 0.78mm/px · z∈[-94,+70]mm · 14 of 32 slices shown (2 of 2)]
[im 1/32]
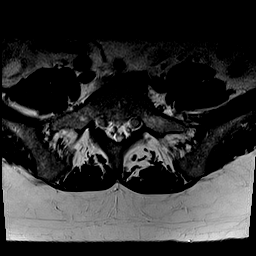
[im 3/32]
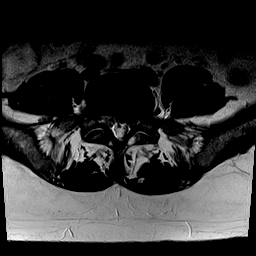
[im 5/32]
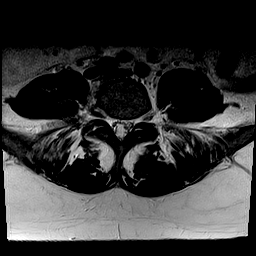
[im 8/32]
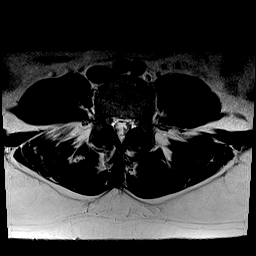
[im 10/32]
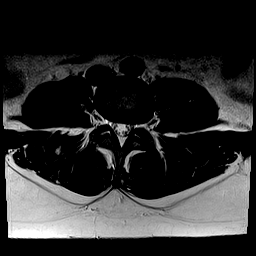
[im 12/32]
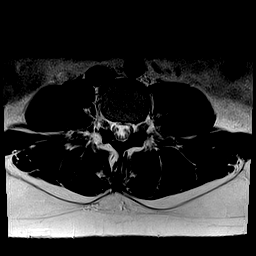
[im 15/32]
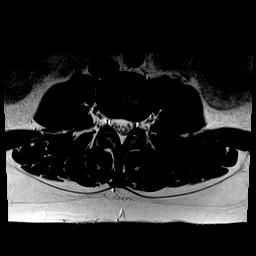
[im 17/32]
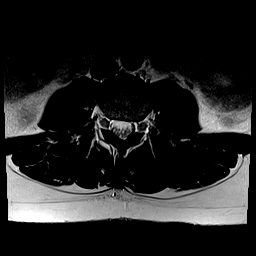
[im 20/32]
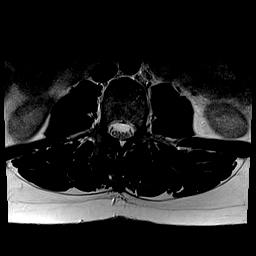
[im 22/32]
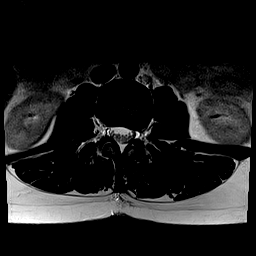
[im 24/32]
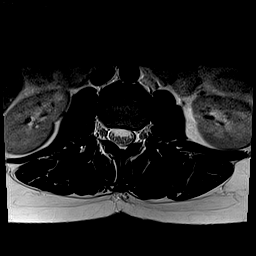
[im 27/32]
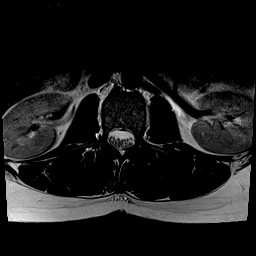
[im 29/32]
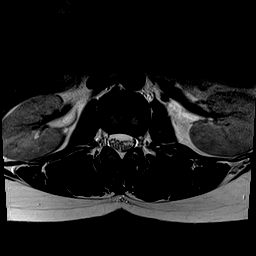
[im 32/32]
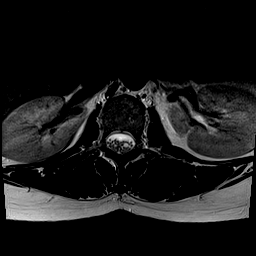

[Series 6: T1 · axial · 4.0mm · 0.78mm/px · z∈[-94,+70]mm · 14 of 32 slices shown (2 of 2)]
[im 1/32]
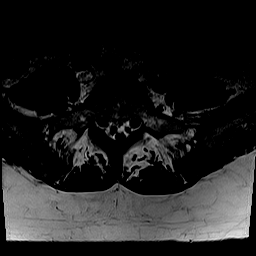
[im 3/32]
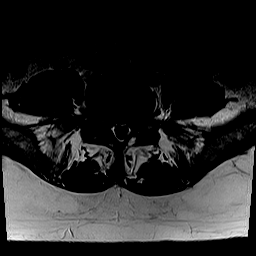
[im 5/32]
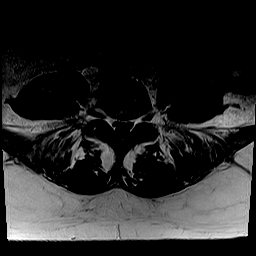
[im 8/32]
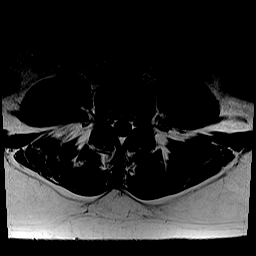
[im 10/32]
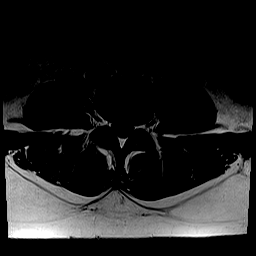
[im 12/32]
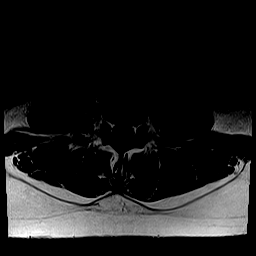
[im 15/32]
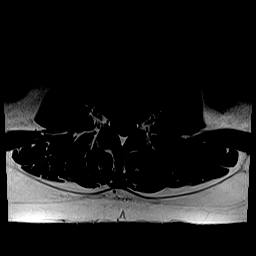
[im 17/32]
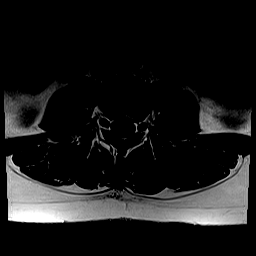
[im 20/32]
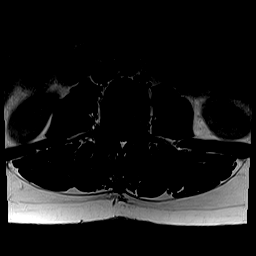
[im 22/32]
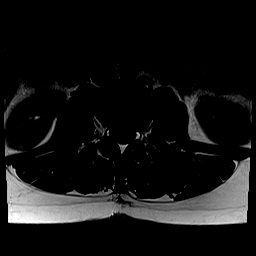
[im 24/32]
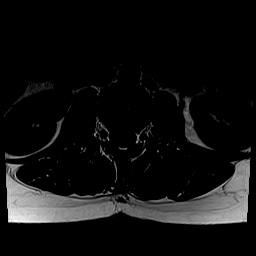
[im 27/32]
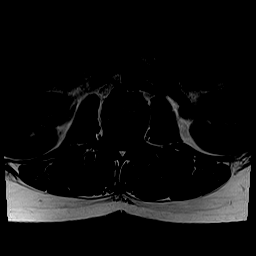
[im 29/32]
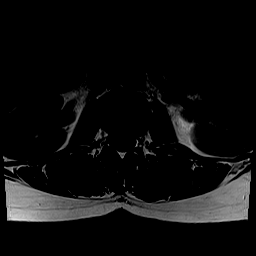
[im 32/32]
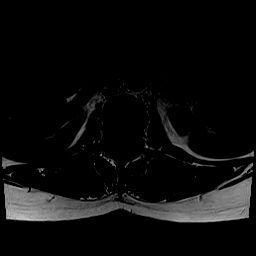

[48 of 48 positions shown; findings below may reference images not displayed]

FINDINGS: Segmentation:  Normal

Alignment:  Mild retrolisthesis L5-S1 unchanged from the prior study

Vertebrae:  Normal bone marrow.  Negative for fracture or mass.

Conus medullaris and cauda equina: Conus extends to the L1 level.
Conus and cauda equina appear normal.

Paraspinal and other soft tissues: Paraspinous soft tissues normal.

Disc levels:

L1-2: Negative

L2-3: Negative

L3-4: Negative

L4-5: Small extraforaminal disc protrusion on the left has developed
since the prior study. No neural compression. Mild facet
degeneration. Spinal canal adequate in size

L5-S1: Central disc protrusion unchanged from the prior study. Mild
facet degeneration. Mild subarticular stenosis bilaterally unchanged
from the prior study.
IMPRESSION: 1. Small left lateral disc protrusion L4-5 is new since the prior
study
2. Mild retrolisthesis L5-S1. Central disc protrusion is unchanged
from the prior study without neural compression. Mild subarticular
stenosis bilaterally.

## 2021-03-25 ENCOUNTER — Other Ambulatory Visit: Payer: Self-pay | Admitting: Family Medicine

## 2021-03-25 ENCOUNTER — Encounter: Payer: Self-pay | Admitting: Sports Medicine

## 2021-03-25 ENCOUNTER — Telehealth: Payer: Self-pay

## 2021-03-25 ENCOUNTER — Other Ambulatory Visit: Payer: Self-pay

## 2021-03-25 MED ORDER — TRAMADOL HCL 50 MG PO TABS: 100.0000 mg | ORAL_TABLET | Freq: Three times a day (TID) | ORAL | 3 refills | Status: DC | PRN

## 2021-03-25 MED ORDER — GABAPENTIN 600 MG PO TABS: ORAL_TABLET | ORAL | 2 refills | Status: DC

## 2021-03-25 NOTE — Telephone Encounter (Signed)
Done.3 m refill. Would conside rre-eval if she is needing this chronically. Denny Levy

## 2021-05-20 ENCOUNTER — Encounter: Payer: Self-pay | Admitting: Sports Medicine

## 2021-05-21 ENCOUNTER — Other Ambulatory Visit: Payer: Self-pay

## 2021-05-21 ENCOUNTER — Other Ambulatory Visit: Payer: Self-pay | Admitting: Sports Medicine

## 2021-05-21 MED ORDER — TRAMADOL HCL 50 MG PO TABS: 100.0000 mg | ORAL_TABLET | Freq: Three times a day (TID) | ORAL | 3 refills | Status: DC | PRN

## 2021-05-22 ENCOUNTER — Other Ambulatory Visit: Payer: Self-pay

## 2021-06-19 ENCOUNTER — Other Ambulatory Visit: Payer: Self-pay

## 2021-06-19 ENCOUNTER — Encounter: Payer: Self-pay | Admitting: Sports Medicine

## 2021-06-19 MED ORDER — GABAPENTIN 600 MG PO TABS: ORAL_TABLET | ORAL | 2 refills | Status: DC

## 2021-07-11 ENCOUNTER — Encounter: Payer: Self-pay | Admitting: Family Medicine

## 2021-07-11 ENCOUNTER — Other Ambulatory Visit: Payer: Self-pay | Admitting: Sports Medicine

## 2021-07-11 MED ORDER — TRAMADOL HCL 50 MG PO TABS: 100.0000 mg | ORAL_TABLET | Freq: Three times a day (TID) | ORAL | 3 refills | Status: DC | PRN

## 2021-08-07 ENCOUNTER — Ambulatory Visit: Payer: BC Managed Care – PPO | Admitting: Sports Medicine

## 2021-08-08 ENCOUNTER — Other Ambulatory Visit: Payer: Self-pay | Admitting: Sports Medicine

## 2021-08-15 ENCOUNTER — Other Ambulatory Visit: Payer: Self-pay

## 2021-08-15 ENCOUNTER — Encounter: Payer: Self-pay | Admitting: Sports Medicine

## 2021-08-15 MED ORDER — MELOXICAM 15 MG PO TABS
15.0000 mg | ORAL_TABLET | Freq: Every day | ORAL | 1 refills | Status: DC | PRN
Start: 2021-08-15 — End: 2023-11-26

## 2021-08-22 ENCOUNTER — Ambulatory Visit: Payer: BC Managed Care – PPO | Admitting: Sports Medicine

## 2021-08-22 DIAGNOSIS — M79605 Pain in left leg: Secondary | ICD-10-CM

## 2021-08-22 DIAGNOSIS — M545 Low back pain, unspecified: Secondary | ICD-10-CM

## 2021-08-22 MED ORDER — GABAPENTIN 600 MG PO TABS: ORAL_TABLET | ORAL | 2 refills | Status: DC

## 2021-08-22 NOTE — Patient Instructions (Signed)
You have cavus feet with some loss of arch in forefoot and long arch Makes foot wider and longer We need to make you orthotics for walking  Hands - the nodules are from mild arthritis Use voltaren gel OTC  At present Let's stay with high dose of gabapentin On tramadol - you can experiment  Stay with at least 1 3x per day But if doing well see if you can get by with 1 rather than 2  We can consider a trial of Lyrica in the future

## 2021-08-22 NOTE — Assessment & Plan Note (Signed)
Patient is doing very well She unfortunately has had the chronic back issues for many years now I think she is doing the right things for prevention I did suggest that we try more intermittent use of the tramadol rather than regular If the pain does not worsen with this approach she may be able to take a lower overall dose I am leaving the gabapentin at high dose  In the future may consider a trial of Lyrica to see if it works any better than the gabapentin but since she is doing well I hesitated to make the change at this time  She will recheck with me in about 6 months

## 2021-08-22 NOTE — Progress Notes (Signed)
Chief complaint chronic low back pain with left-sided radiation  Patient returns today for periodic assessment so we can discuss her medication usage and be sure that we are on the right track. She is working from home and that has probably helped.  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  So she does not use these medicines regularly but has a history of significant migraine followed by neurology and does occasionally need them.  For her low back she continues on stretching and flexion exercises that I have prescribed as well as the walking.  She takes gabapentin 600 mg 3 times daily and this is really controlled her radiating symptoms.  She states that even when she misses a day of gabapentin she will notice some increase in symptoms.  She does inquire whether Lyrica might be an option and we discussed this.  As for pain she does take meloxicam on days when she has a lot of back and joint aching.  She uses tramadol 2 tablets 3 times a day with the gabapentin.  She states this does a very good job controlling her pain and she is not sure she needs to take it all 3 times daily.  Physical exam Pleasant female in no acute distress BP (!) 144/72   Ht 5\' 7"  (1.702 m)   Wt 215 lb (97.5 kg)   BMI 33.67 kg/m   Back exam She gets pain at about 30 to 40 degrees of back flexion Pain on lateral bend to both sides No significant pain with rotation Extension is good with no pain before 30 degrees  Strength appears good and there is no numbness She can do heel walk toe walk and  crossover walk without difficulty

## 2021-09-18 ENCOUNTER — Encounter: Payer: Self-pay | Admitting: Sports Medicine

## 2021-09-19 ENCOUNTER — Other Ambulatory Visit: Payer: Self-pay | Admitting: Sports Medicine

## 2021-09-19 MED ORDER — TRAMADOL HCL 100 MG PO TABS: 100.0000 mg | ORAL_TABLET | Freq: Three times a day (TID) | ORAL | 3 refills | Status: DC

## 2021-09-19 MED ORDER — TRAMADOL HCL 50 MG PO TABS: ORAL_TABLET | ORAL | 3 refills | Status: DC

## 2021-11-21 ENCOUNTER — Other Ambulatory Visit: Payer: Self-pay | Admitting: *Deleted

## 2021-11-21 ENCOUNTER — Encounter: Payer: Self-pay | Admitting: Sports Medicine

## 2021-11-21 MED ORDER — GABAPENTIN 600 MG PO TABS
ORAL_TABLET | ORAL | 2 refills | Status: DC
Start: 2021-11-21 — End: 2022-02-18

## 2022-01-16 ENCOUNTER — Encounter: Payer: Self-pay | Admitting: Sports Medicine

## 2022-01-17 ENCOUNTER — Other Ambulatory Visit: Payer: Self-pay | Admitting: Family Medicine

## 2022-01-17 MED ORDER — TRAMADOL HCL 50 MG PO TABS: ORAL_TABLET | ORAL | 0 refills | Status: DC

## 2022-01-17 NOTE — Progress Notes (Signed)
Filling for Dr. Darrick Penna as he's out of the office.

## 2022-02-18 ENCOUNTER — Ambulatory Visit: Payer: BC Managed Care – PPO | Admitting: Sports Medicine

## 2022-02-18 VITALS — BP 124/84 | Ht 67.0 in | Wt 215.0 lb

## 2022-02-18 DIAGNOSIS — M79671 Pain in right foot: Secondary | ICD-10-CM | POA: Insufficient documentation

## 2022-02-18 DIAGNOSIS — M79672 Pain in left foot: Secondary | ICD-10-CM

## 2022-02-18 MED ORDER — GABAPENTIN 600 MG PO TABS: ORAL_TABLET | ORAL | 2 refills | Status: DC

## 2022-02-18 MED ORDER — TRAMADOL HCL 50 MG PO TABS: ORAL_TABLET | ORAL | 3 refills | Status: DC

## 2022-02-18 NOTE — Progress Notes (Signed)
Chief complaint chronic low back pain with radiculopathy  She is staying in pretty good control on fairly high-dose tramadol at 100 3 times daily and gabapentin 1200 3 times daily She has been on this regimen for considerable time and it is keeping her functional  She returns to request new orthotics today She has bilateral cavus feet Before orthotic she would have chronic foot and arch pain as well as increased back pain The orthotics have reduced this  Physical exam Pleasant obese female in no acute distress BP 124/84   Ht 5\' 7"  (1.702 m)   Wt 215 lb (97.5 kg)   BMI 33.67 kg/m   Feet are noted to be cavus shape There is some loss of longitudinal arch height bilaterally There is forefoot widening noted bilaterally  Patient was fitted for a : standard, cushioned, semi-rigid orthotic. The orthotic was heated and afterward the patient stood on the orthotic blank positioned on the orthotic stand. The patient was positioned in subtalar neutral position and 10 degrees of ankle dorsiflexion in a weight bearing stance. After completion of molding, a stable base was applied to the orthotic blank. The blank was ground to a stable position for weight bearing. Size: 9 Bleu med. EVA base Base: Med. Density blue EVA Posting: none Additional orthotic padding: none  At completion of the orthotic patient has good comfort in her walking gait with neutral

## 2022-02-18 NOTE — Assessment & Plan Note (Signed)
Because she has responded so well to orthotics in the past we will continue her on custom orthotics  These were prepared today and we would expect them to last 3 to 5 years

## 2022-05-19 ENCOUNTER — Other Ambulatory Visit: Payer: Self-pay | Admitting: Sports Medicine

## 2022-05-22 ENCOUNTER — Ambulatory Visit: Payer: BC Managed Care – PPO | Admitting: Podiatry

## 2022-06-16 ENCOUNTER — Other Ambulatory Visit: Payer: Self-pay | Admitting: Family Medicine

## 2022-06-16 ENCOUNTER — Encounter: Payer: Self-pay | Admitting: Sports Medicine

## 2022-06-19 ENCOUNTER — Other Ambulatory Visit: Payer: Self-pay | Admitting: Sports Medicine

## 2022-06-19 MED ORDER — TRAMADOL HCL 50 MG PO TABS: ORAL_TABLET | ORAL | 3 refills | Status: DC

## 2022-08-19 ENCOUNTER — Other Ambulatory Visit: Payer: Self-pay | Admitting: *Deleted

## 2022-08-19 ENCOUNTER — Encounter: Payer: Self-pay | Admitting: Sports Medicine

## 2022-08-19 MED ORDER — GABAPENTIN 600 MG PO TABS: ORAL_TABLET | ORAL | 2 refills | Status: DC

## 2022-10-15 ENCOUNTER — Encounter: Payer: Self-pay | Admitting: Sports Medicine

## 2022-10-16 ENCOUNTER — Other Ambulatory Visit: Payer: Self-pay | Admitting: Sports Medicine

## 2022-10-16 MED ORDER — TRAMADOL HCL 50 MG PO TABS: ORAL_TABLET | ORAL | 3 refills | Status: DC

## 2022-11-17 ENCOUNTER — Other Ambulatory Visit: Payer: Self-pay

## 2022-11-17 ENCOUNTER — Encounter: Payer: Self-pay | Admitting: Sports Medicine

## 2022-11-17 MED ORDER — GABAPENTIN 600 MG PO TABS: ORAL_TABLET | ORAL | 2 refills | Status: DC

## 2022-11-24 ENCOUNTER — Ambulatory Visit: Payer: BC Managed Care – PPO | Admitting: Allergy

## 2023-01-22 ENCOUNTER — Telehealth: Payer: BC Managed Care – PPO | Admitting: Family Medicine

## 2023-01-27 ENCOUNTER — Telehealth (INDEPENDENT_AMBULATORY_CARE_PROVIDER_SITE_OTHER): Payer: BC Managed Care – PPO | Admitting: Family Medicine

## 2023-01-27 ENCOUNTER — Encounter: Payer: Self-pay | Admitting: Family Medicine

## 2023-01-27 DIAGNOSIS — M5416 Radiculopathy, lumbar region: Secondary | ICD-10-CM

## 2023-01-27 DIAGNOSIS — M545 Low back pain, unspecified: Secondary | ICD-10-CM | POA: Diagnosis not present

## 2023-01-27 DIAGNOSIS — G8929 Other chronic pain: Secondary | ICD-10-CM

## 2023-01-27 MED ORDER — TRAMADOL HCL 50 MG PO TABS: ORAL_TABLET | ORAL | 3 refills | Status: DC

## 2023-01-27 MED ORDER — GABAPENTIN 600 MG PO TABS: ORAL_TABLET | ORAL | 3 refills | Status: DC

## 2023-01-27 NOTE — Assessment & Plan Note (Signed)
Chronic LBP with associated radiculopathy - well controlled with gabapentin and tramadol - no red flags  PLAN: - prior visit notes from 02/18/22 and 08/22/21 reviewed today - medication reconciliation completed - Refill Rx Gabapentin 600mg  2 tabs PO tid given - Refill Rx Tramadol 50mg  2 tabs PO tid given - Recommend IOV with Dr Darrick Penna prior to next refill

## 2023-01-27 NOTE — Progress Notes (Signed)
DATE OF VISIT: 01/27/2023        Sandra Rocha DOB: 12-04-1975 MRN: [id number]  Virtual Visit via Video Note  I connected with Sandra Rocha on 01/27/23 at  8:30 AM EST by a video enabled telemedicine application and verified that I am speaking with the correct person using two identifiers.  Location: Patient: home Provider: office   I discussed the limitations of evaluation and management by telemedicine and the availability of in person appointments. The patient expressed understanding and agreed to proceed.   CC:  f/u for medication refill  History of present Illness: Sandra Rocha is a 47 y.o. female who presents for a follow-up visit for medication refill Patient of Dr Darrick Penna who is unavailable Last seen 02/18/22 for orthotics and medication refills Hx chronic LBP with radiculopathy - stable on Tramadol 100mg  tid + Gabapentin 1200mg  tid - has been on this regimen for some time - regimen allows her to be functional - needs refills on both medications today  Medications:  Outpatient Encounter Medications as of 01/27/2023  Medication Sig   almotriptan (AXERT) 12.5 MG tablet    busPIRone (BUSPAR) 10 MG tablet buspirone 10 mg tablet   MAGNESIUM ASPARTATE PO Take by mouth daily.   meloxicam (MOBIC) 15 MG tablet Take 1 tablet (15 mg total) by mouth daily as needed for pain. Take with food.   Multiple Vitamins-Minerals (MULTIVITAMIN PO) Take by mouth daily.   norethindrone-ethinyl estradiol-iron (LOESTRIN FE) 1.5-30 MG-MCG tablet Take one active pill daily, skip placebo pills, for menstrual suppression   NURTEC 75 MG TBDP    PARoxetine (PAXIL) 10 MG tablet Take 10 mg by mouth daily.   promethazine (PHENERGAN) 25 MG tablet Take 25-50 mg by mouth every 8 (eight) hours as needed.   rizatriptan (MAXALT-MLT) 10 MG disintegrating tablet Take 1 tablet (10 mg total) by mouth as needed. May repeat in 2 hours if needed   SUMAtriptan (IMITREX) 100 MG tablet Take 100 mg by mouth as  directed.   Thiamine HCl (VITAMIN B-1 PO) Take by mouth daily.   tirzepatide (ZEPBOUND) 5 MG/0.5ML Pen Inject 5 mg into the skin once a week.   tiZANidine (ZANAFLEX) 4 MG tablet Take 4 mg by mouth every 8 (eight) hours as needed for muscle spasms.   [DISCONTINUED] gabapentin (NEURONTIN) 600 MG tablet Take 2 tabs (1200 mg) 3 times daily as needed   [DISCONTINUED] traMADol (ULTRAM) 50 MG tablet 2 tabs po tid   gabapentin (NEURONTIN) 600 MG tablet Take 2 tabs (1200 mg) 3 times daily as needed   traMADol (ULTRAM) 50 MG tablet 2 tabs po tid   [DISCONTINUED] cyclobenzaprine (FLEXERIL) 10 MG tablet Take 1 tablet (10 mg total) by mouth at bedtime as needed for muscle spasms.   [DISCONTINUED] hydrOXYzine (ATARAX/VISTARIL) 25 MG tablet Take 1/2 - 1 tab po tid prn   [DISCONTINUED] lidocaine (LIDODERM) 5 % Place 1 patch onto the skin daily. Remove & Discard patch within 12 hours or as directed by MD   No facility-administered encounter medications on file as of 01/27/2023.    Allergies: has No Known Allergies.  Physical Examination: Vitals: There were no vitals taken for this visit. GENERAL:  Sandra Rocha is a 47 y.o. female appearing their stated age, alert and oriented x 3, in no apparent distress.  SKIN: no rashes or lesions, skin clean, dry, intact on face MSK: normal gross upper ext strength NEURO: no focal deficits  Assessment & Plan Chronic midline low back pain  without sciatica Chronic LBP with associated radiculopathy - well controlled with gabapentin and tramadol - no red flags  PLAN: - prior visit notes from 02/18/22 and 08/22/21 reviewed today - medication reconciliation completed - Refill Rx Gabapentin 600mg  2 tabs PO tid given - Refill Rx Tramadol 50mg  2 tabs PO tid given - Recommend IOV with Dr Darrick Penna prior to next refill Lumbar radiculopathy Chronic LBP with associated radiculopathy - well controlled with gabapentin and tramadol - no red flags  PLAN: - prior visit notes  from 02/18/22 and 08/22/21 reviewed today - medication reconciliation completed - Refill Rx Gabapentin 600mg  2 tabs PO tid given - Refill Rx Tramadol 50mg  2 tabs PO tid given - Recommend IOV with Dr Darrick Penna prior to next refill     I discussed the assessment and treatment plan with the patient. The patient was provided an opportunity to ask questions and all were answered. The patient agreed with the plan and demonstrated an understanding of the instructions.   The patient was advised to call back or seek an in-person evaluation if the symptoms worsen or if the condition fails to improve as anticipated.  I provided 15 minutes of non-face-to-face time during this encounter.   Sandra Devon, DO    Encounter Diagnoses  Name Primary?   Chronic midline low back pain without sciatica Yes   Lumbar radiculopathy     No orders of the defined types were placed in this encounter.

## 2023-05-05 ENCOUNTER — Ambulatory Visit: Payer: Self-pay | Admitting: Family Medicine

## 2023-05-05 ENCOUNTER — Encounter: Payer: Self-pay | Admitting: Family Medicine

## 2023-05-05 VITALS — BP 122/84 | Ht 67.0 in | Wt 215.0 lb

## 2023-05-05 DIAGNOSIS — M5416 Radiculopathy, lumbar region: Secondary | ICD-10-CM | POA: Diagnosis not present

## 2023-05-05 DIAGNOSIS — M545 Low back pain, unspecified: Secondary | ICD-10-CM

## 2023-05-05 DIAGNOSIS — G8929 Other chronic pain: Secondary | ICD-10-CM

## 2023-05-05 MED ORDER — GABAPENTIN 600 MG PO TABS: ORAL_TABLET | ORAL | 6 refills | Status: DC

## 2023-05-05 MED ORDER — TRAMADOL HCL 50 MG PO TABS: ORAL_TABLET | ORAL | 3 refills | Status: DC

## 2023-05-05 NOTE — Assessment & Plan Note (Signed)
 Chronic low back pain with associated radiculopathy-well-controlled with gabapentin and tramadol.  No new symptoms and doing well.  Has been able to increase her activity and is now doing more exercise  Plan: -[facility] PMP reviewed, no concerns -Refill Rx gabapentin 600 mg 2 tabs p.o. 3 times daily given, 180 tabs, 6 refills -Refill Rx tramadol 50 mg 2 tabs p.o. 3 times daily as needed given, 180 tabs, 3 refills.  Discussed with patient trying to decrease tramadol dose and using only as needed.  Dr. Darrick Penna had discussed this with her in the past at prior visits as well.  Advised starting to see if she can tolerate 1 tab 3 times daily as needed.  Explained concerns with possible tolerance with taking medication on a regular basis.  She is open to try cutting back. -She will continue current activity as she is doing -Should follow-up with myself or Dr. Darrick Penna in 6 months to check her progress.  Can reach out sooner for refill on her tramadol if needed

## 2023-05-05 NOTE — Progress Notes (Signed)
 DATE OF VISIT: 05/05/2023        Sandra Rocha DOB: [birth year] MRN: [id number]  CC: Follow-up sciatica and med check  History of present Illness: Sandra Rocha is a 48 y.o. female who presents for a follow-up visit  Last completed video visit with me 01/27/2023 for medication refills Has history of chronic low back pain with radiculopathy which has been stable on tramadol 100 mg 3 times daily and gabapentin 1200 mg 3 times daily  Today she reports symptoms have been stable Medications have been helpful -Is taking tramadol 100 mg 3 times daily regularly.  Denies any side effects.  Has not tried to decrease medication -Taking gabapentin 1200 mg 3 times daily.  Denies any side effects.  Has been effective Daughter currently going to school at [employer], does note some pain/discomfort when traveling in the car to [employer] Also goes to [place] twice a week in the car Walking regularly - 4-5 days/wk Doing regular stretches Doing yoga a few days a week No side effects from the medications No new symptoms.  Medications:  Outpatient Encounter Medications as of 05/05/2023  Medication Sig   almotriptan (AXERT) 12.5 MG tablet    busPIRone (BUSPAR) 10 MG tablet buspirone 10 mg tablet   gabapentin (NEURONTIN) 600 MG tablet Take 2 tabs (1200 mg) 3 times daily as needed   MAGNESIUM ASPARTATE PO Take by mouth daily.   meloxicam (MOBIC) 15 MG tablet Take 1 tablet (15 mg total) by mouth daily as needed for pain. Take with food.   Multiple Vitamins-Minerals (MULTIVITAMIN PO) Take by mouth daily.   norethindrone-ethinyl estradiol-iron (LOESTRIN FE) 1.5-30 MG-MCG tablet Take one active pill daily, skip placebo pills, for menstrual suppression   NURTEC 75 MG TBDP    PARoxetine (PAXIL) 10 MG tablet Take 10 mg by mouth daily.   promethazine (PHENERGAN) 25 MG tablet Take 25-50 mg by mouth every 8 (eight) hours as needed.   rizatriptan (MAXALT-MLT) 10 MG disintegrating tablet Take 1 tablet (10 mg total) by mouth as  needed. May repeat in 2 hours if needed   SUMAtriptan (IMITREX) 100 MG tablet Take 100 mg by mouth as directed.   Thiamine HCl (VITAMIN B-1 PO) Take by mouth daily.   tirzepatide (ZEPBOUND) 5 MG/0.5ML Pen Inject 5 mg into the skin once a week.   tiZANidine (ZANAFLEX) 4 MG tablet Take 4 mg by mouth every 8 (eight) hours as needed for muscle spasms.   traMADol (ULTRAM) 50 MG tablet 2 tabs po tid   [DISCONTINUED] gabapentin (NEURONTIN) 600 MG tablet Take 2 tabs (1200 mg) 3 times daily as needed   [DISCONTINUED] traMADol (ULTRAM) 50 MG tablet 2 tabs po tid   No facility-administered encounter medications on file as of 05/05/2023.    Allergies: has no known allergies.  Physical Examination: Vitals: BP 122/84   Ht 5\' 7"  (1.702 m)   Wt 215 lb (97.5 kg)   BMI 33.67 kg/m  GENERAL:  Sandra Rocha is a 48 y.o. female appearing their stated age, alert and oriented x 3, in no apparent distress.  SKIN: no rashes or lesions, skin clean, dry, intact MSK: Spine with full range of motion without pain.  No midline or paraspinal tenderness.  Negative straight leg raise bilaterally.  Able to toe walk and heel walk.  Lower extremity strength 5/5 throughout.  Walking without a limp NEURO: sensation intact to light touch, DTR 2/4 Achilles and patella bilaterally VASC: no edema  Assessment & Plan Chronic midline low  back pain without sciatica Chronic low back pain with associated radiculopathy-well-controlled with gabapentin and tramadol.  No new symptoms and doing well.  Has been able to increase her activity and is now doing more exercise  Plan: -[facility] PMP reviewed, no concerns -Refill Rx gabapentin 600 mg 2 tabs p.o. 3 times daily given, 180 tabs, 6 refills -Refill Rx tramadol 50 mg 2 tabs p.o. 3 times daily as needed given, 180 tabs, 3 refills.  Discussed with patient trying to decrease tramadol dose and using only as needed.  Dr. Darrick Penna had discussed this with her in the past at prior visits  as well.  Advised starting to see if she can tolerate 1 tab 3 times daily as needed.  Explained concerns with possible tolerance with taking medication on a regular basis.  She is open to try cutting back. -She will continue current activity as she is doing -Should follow-up with myself or Dr. Darrick Penna in 6 months to check her progress.  Can reach out sooner for refill on her tramadol if needed Lumbar radiculopathy Chronic low back pain with associated radiculopathy-well-controlled with gabapentin and tramadol.  No new symptoms and doing well.  Has been able to increase her activity and is now doing more exercise  Plan: -[facility] PMP reviewed, no concerns -Refill Rx gabapentin 600 mg 2 tabs p.o. 3 times daily given, 180 tabs, 6 refills -Refill Rx tramadol 50 mg 2 tabs p.o. 3 times daily as needed given, 180 tabs, 3 refills.  Discussed with patient trying to decrease tramadol dose and using only as needed.  Dr. Darrick Penna had discussed this with her in the past at prior visits as well.  Advised starting to see if she can tolerate 1 tab 3 times daily as needed.  Explained concerns with possible tolerance with taking medication on a regular basis.  She is open to try cutting back. -She will continue current activity as she is doing -Should follow-up with myself or Dr. Darrick Penna in 6 months to check her progress.  Can reach out sooner for refill on her tramadol if needed   Patient expressed understanding & agreement with above.  Encounter Diagnoses  Name Primary?   Chronic midline low back pain without sciatica Yes   Lumbar radiculopathy     No orders of the defined types were placed in this encounter.

## 2023-07-02 ENCOUNTER — Other Ambulatory Visit

## 2023-07-10 ENCOUNTER — Ambulatory Visit: Admit: 2023-07-10 | Admitting: Obstetrics and Gynecology

## 2023-07-10 SURGERY — ROBOTIC ASSISTED TOTAL HYSTERECTOMY WITH SALPINGECTOMY
Anesthesia: Choice | Site: Uterus

## 2023-07-21 HISTORY — PX: MASTECTOMY: SHX3

## 2023-09-09 ENCOUNTER — Encounter: Payer: Self-pay | Admitting: Family Medicine

## 2023-09-09 DIAGNOSIS — G8929 Other chronic pain: Secondary | ICD-10-CM

## 2023-09-09 DIAGNOSIS — M5416 Radiculopathy, lumbar region: Secondary | ICD-10-CM

## 2023-09-11 ENCOUNTER — Other Ambulatory Visit: Payer: Self-pay | Admitting: Family Medicine

## 2023-09-11 DIAGNOSIS — G8929 Other chronic pain: Secondary | ICD-10-CM

## 2023-09-11 DIAGNOSIS — M5416 Radiculopathy, lumbar region: Secondary | ICD-10-CM

## 2023-09-11 MED ORDER — TRAMADOL HCL 50 MG PO TABS: ORAL_TABLET | ORAL | 1 refills | Status: DC

## 2023-11-04 ENCOUNTER — Ambulatory Visit (INDEPENDENT_AMBULATORY_CARE_PROVIDER_SITE_OTHER): Admitting: Sports Medicine

## 2023-11-04 VITALS — BP 138/86 | Ht 67.0 in | Wt 225.0 lb

## 2023-11-04 DIAGNOSIS — M545 Low back pain, unspecified: Secondary | ICD-10-CM

## 2023-11-04 DIAGNOSIS — M5416 Radiculopathy, lumbar region: Secondary | ICD-10-CM | POA: Diagnosis not present

## 2023-11-04 DIAGNOSIS — G8929 Other chronic pain: Secondary | ICD-10-CM

## 2023-11-04 MED ORDER — GABAPENTIN 600 MG PO TABS: ORAL_TABLET | ORAL | 6 refills | Status: AC

## 2023-11-04 MED ORDER — TRAMADOL HCL 50 MG PO TABS: ORAL_TABLET | ORAL | 3 refills | Status: DC

## 2023-11-04 NOTE — Progress Notes (Signed)
 Chief complaint periodic follow-up of lumbar radiculopathy and lumbar disc disease treated with high-dose gabapentin  and tramadol   The patient has done very well on this regimen as far as controlling her back pain and symptoms.  She still periodically will get a flare with sciatica going down her leg all the way to her feet.  However she is staying active and walking more. Unfortunately since her last visit with me she has developed breast cancer and had to go through a partial mastectomy and just completed 4 weeks of radiation.  Because of this and the procedure she was undergoing she did not wean her medications and has stayed at the same dose of gabapentin  and tramadol  along with her other medications. She has had no other worrisome symptoms of bowel or bladder dysfunction.  Physical exam Pleasant white female in no acute distress BP 138/86   Ht 5' 7 (1.702 m)   Wt 225 lb (102.1 kg)   BMI 35.24 kg/m   Today her back motion is actually improved with about 25 degrees of extension She has almost full lumbar flexion Good lateral bend to the right and left although the left side is somewhat tight but she feels it in her upper chest where she has had radiation Good rotation of her lumbar spine  She can do heel and toe walk without difficulty She can do tandem walk without difficulty No sensory changes noted

## 2023-11-04 NOTE — Assessment & Plan Note (Signed)
 Her lumbar radiculopathy is in good control Now that we are getting to the end of her active breast cancer treatment we might be a little more aggressive about weaning her medication I would like to see her gradually reduced from the 100 mg of tramadol  3 times a day down to 50 mg 3 times a day We will keep the gabapentin  at 1203 times a day but in the future may try to wean that down as well  At the present time her back rehab should mostly consist of walking Once she is over the effects of the radiation we may want to start some more specific back stretching exercises and other things to improve her back outcomes  She should recheck with me in 6 months

## 2023-11-05 ENCOUNTER — Ambulatory Visit: Admitting: Sports Medicine

## 2023-11-26 ENCOUNTER — Ambulatory Visit: Admitting: Internal Medicine

## 2023-11-26 ENCOUNTER — Encounter: Payer: Self-pay | Admitting: Internal Medicine

## 2023-11-26 ENCOUNTER — Ambulatory Visit
Admission: RE | Admit: 2023-11-26 | Discharge: 2023-11-26 | Disposition: A | Discharge status: Home / Self Care | Source: Ambulatory Visit | Attending: Internal Medicine | Admitting: Internal Medicine

## 2023-11-26 VITALS — BP 132/74 | Ht 67.0 in | Wt 236.6 lb

## 2023-11-26 DIAGNOSIS — C50012 Malignant neoplasm of nipple and areola, left female breast: Secondary | ICD-10-CM | POA: Diagnosis not present

## 2023-11-26 DIAGNOSIS — R202 Paresthesia of skin: Secondary | ICD-10-CM | POA: Diagnosis present

## 2023-11-26 DIAGNOSIS — E6609 Other obesity due to excess calories: Secondary | ICD-10-CM | POA: Insufficient documentation

## 2023-11-26 DIAGNOSIS — M79602 Pain in left arm: Secondary | ICD-10-CM | POA: Insufficient documentation

## 2023-11-26 DIAGNOSIS — Z170 Estrogen receptor positive status [ER+]: Secondary | ICD-10-CM

## 2023-11-26 DIAGNOSIS — M542 Cervicalgia: Secondary | ICD-10-CM

## 2023-11-26 DIAGNOSIS — F32A Depression, unspecified: Secondary | ICD-10-CM

## 2023-11-26 DIAGNOSIS — G2581 Restless legs syndrome: Secondary | ICD-10-CM

## 2023-11-26 DIAGNOSIS — C50919 Malignant neoplasm of unspecified site of unspecified female breast: Secondary | ICD-10-CM | POA: Insufficient documentation

## 2023-11-26 DIAGNOSIS — M5416 Radiculopathy, lumbar region: Secondary | ICD-10-CM

## 2023-11-26 DIAGNOSIS — G43019 Migraine without aura, intractable, without status migrainosus: Secondary | ICD-10-CM

## 2023-11-26 DIAGNOSIS — F419 Anxiety disorder, unspecified: Secondary | ICD-10-CM

## 2023-11-26 DIAGNOSIS — E66812 Obesity, class 2: Secondary | ICD-10-CM

## 2023-11-26 MED ORDER — OXYCODONE-ACETAMINOPHEN 5-325 MG PO TABS: 1.0000 | ORAL_TABLET | Freq: Three times a day (TID) | ORAL | 0 refills | Status: AC | PRN

## 2023-11-26 MED ORDER — PREDNISONE 10 MG PO TABS: ORAL_TABLET | ORAL | 0 refills | Status: DC

## 2023-11-26 NOTE — Assessment & Plan Note (Signed)
 Stable on citalopram 40 mg daily Support offered

## 2023-11-26 NOTE — Progress Notes (Signed)
 Subjective:    Patient ID: PATTON SWISHER, female    DOB: 1976-03-03, 48 y.o.   MRN: [id number]  HPI  Patient presents to clinic today to establish care and for management of the conditions listed below.  Migraines: These occur at least 1 x week.  Triggered by stress, lack of sleep.  She is getting botox every 3 months. She is taking topomax as prescribed and sumatriptan , rizatriptan  as needed with good relief of symptoms.  She follows with neurology.  Left Breast cancer: She has had a partial mastectomy with LND (07/2023), and she reports she just finished radiation. She follows with oncology through [facility].  Anxiety and depression: Chronic, but she does feel like this is worse since her breast cancer diagnosis. This is managed on citalopram. She has been on lorazepam, paroxetine  and buspirone.  She is not currently seeing a therapist.  She denies SI/HI.  Lumbar radiculopathy: She has had injections in the past. Cyclobenzaprine , She is taking gabapentin , and tramadol  as prescribed.  MRI lumbar spine from 01/2019 reviewed.  She follows with sports medicine.  RLS: Managed with horizant as prescribed. She follows with neurology.   She also reports left arm pain pretty much since 07/2023, when she had her partial mastectomy. The pain worsened during radiation and has really kicked up in the last week. She describe the pain as sore and achy, but does have intermittent sharp and stabbing pain. The pain is not worsened with movement. She denies numbness, tingling or swelling in the area but has had weakness. She denies any injury to the area. She has tried oxycodone , gabapentin  and tramadol  with minimal relief of symptoms.   Review of Systems   Past Medical History:  Diagnosis Date   Anxiety    Headache(784.0)    Low back pain    Neuropathy     Current Outpatient Medications  Medication Sig Dispense Refill   almotriptan (AXERT) 12.5 MG tablet      busPIRone (BUSPAR) 10 MG tablet buspirone  10 mg tablet     gabapentin  (NEURONTIN ) 600 MG tablet Take 2 tabs (1200 mg) 3 times daily as needed 180 tablet 6   MAGNESIUM ASPARTATE PO Take by mouth daily.     meloxicam  (MOBIC ) 15 MG tablet Take 1 tablet (15 mg total) by mouth daily as needed for pain. Take with food. 30 tablet 1   Multiple Vitamins-Minerals (MULTIVITAMIN PO) Take by mouth daily.     norethindrone-ethinyl estradiol -iron (LOESTRIN FE) 1.5-30 MG-MCG tablet Take one active pill daily, skip placebo pills, for menstrual suppression     NURTEC 75 MG TBDP      PARoxetine  (PAXIL ) 10 MG tablet Take 10 mg by mouth daily.     promethazine (PHENERGAN) 25 MG tablet Take 25-50 mg by mouth every 8 (eight) hours as needed.     rizatriptan  (MAXALT -MLT) 10 MG disintegrating tablet Take 1 tablet (10 mg total) by mouth as needed. May repeat in 2 hours if needed 10 tablet 6   SUMAtriptan  (IMITREX ) 100 MG tablet Take 100 mg by mouth as directed.     Thiamine HCl (VITAMIN B-1 PO) Take by mouth daily.     tirzepatide (ZEPBOUND) 5 MG/0.5ML Pen Inject 5 mg into the skin once a week.     tiZANidine (ZANAFLEX) 4 MG tablet Take 4 mg by mouth every 8 (eight) hours as needed for muscle spasms.     traMADol  (ULTRAM ) 50 MG tablet 2 tabs po tid 180 tablet 3  No current facility-administered medications for this visit.    No Known Allergies  Family History  Problem Relation Age of Onset   Migraines Mother    Depression Mother    Depression Father    Migraines Maternal Grandfather     Social History   Socioeconomic History   Marital status: Married    Spouse name: [name]   Number of children: 2   Years of education: college   Highest education level: Not on file  Occupational History    Employer:  [employer]  Tobacco Use   Smoking status: Never   Smokeless tobacco: Never  Vaping Use   Vaping status: Never Used  Substance and Sexual Activity   Alcohol use: No    Alcohol/week: 0.0 standard drinks of alcohol   Drug use:  No   Sexual activity: Yes    Birth control/protection: Pill  Other Topics Concern   Not on file  Social History Narrative   Patient lives at home with her husband Quinten)   Patient  Works full time IT trainer. [employer]    Education college.   Right handed.   Caffeine two cup of coffee daily one glass of sweet tea with dinner.   Social Drivers of Corporate investment banker Strain: Low Risk  (05/28/2023)   Received from [facility]   Overall Financial Resource Strain (CARDIA)    Difficulty of Paying Living Expenses: Not hard at all  Food Insecurity: No Food Insecurity (05/28/2023)   [facility] from [facility]   Hunger Vital Sign    Within the past 12 months, you worried that your food would run out before you got the money to buy more.: Never true    Within the past 12 months, the food you bought just didn't last and you didn't have money to get more.: Never true  Transportation Needs: No Transportation Needs (05/28/2023)   Received from [facility] - Transportation    Lack of Transportation (Medical): No    Lack of Transportation (Non-Medical): No  Physical Activity: Not on file  Stress: Not on file  Social Connections: Not on file  Intimate Partner Violence: Not At Risk (07/21/2023)   Received from [facility]   Humiliation, Afraid, Rape, and Kick questionnaire    Within the last year, have you been afraid of your partner or ex-partner?: No    Within the last year, have you been humiliated or emotionally abused in other ways by your partner or ex-partner?: No    Within the last year, have you been kicked, hit, slapped, or otherwise physically hurt by your partner or ex-partner?: No    Within the last year, have you been raped or forced to have any kind of sexual activity by your partner or ex-partner?: No     Constitutional: Patient reports intermittent headaches.  Denies fever, malaise, fatigue, headache or abrupt weight changes.  HEENT: Denies eye pain, eye  redness, ear pain, ringing in the ears, wax buildup, runny nose, nasal congestion, bloody nose, or sore throat. Respiratory: Denies difficulty breathing, shortness of breath, cough or sputum production.   Cardiovascular: Denies chest pain, chest tightness, palpitations or swelling in the hands or feet.  Gastrointestinal: Denies abdominal pain, bloating, constipation, diarrhea or blood in the stool.  GU: Denies urgency, frequency, pain with urination, burning sensation, blood in urine, odor or discharge. Musculoskeletal: Patient reports chronic low back pain neck pain and left arm pain.  Denies decrease in range  of motion, difficulty with gait, or joint swelling.  Skin: Denies redness, rashes, lesions or ulcercations.  Neurological: Patient reports restless legs.  Denies dizziness, difficulty with memory, difficulty with speech or problems with balance and coordination.  Psych: Patient has a history of anxiety and depression.  Denies SI/HI.  No other specific complaints in a complete review of systems (except as listed in HPI above).      Objective:   Physical Exam BP 132/74 (BP Location: Right Arm, Patient Position: Sitting, Cuff Size: Large)   Ht 5' 7 (1.702 m)   Wt 236 lb 9.6 oz (107.3 kg)   LMP 11/16/2023 (Exact Date)   BMI 37.06 kg/m   Wt Readings from Last 3 Encounters:  11/04/23 225 lb (102.1 kg)  05/05/23 215 lb (97.5 kg)  02/18/22 215 lb (97.5 kg)    General: Appears her stated age, obese, in NAD. Skin: Warm, dry and intact.  HEENT: Head: normal shape and size; Eyes: sclera white, no icterus, conjunctiva pink, PERRLA and EOMs intact; Neck:  Neck supple, trachea midline. No masses, lumps or thyromegaly present.  Cardiovascular: Normal rate and rhythm. S1,S2 noted.  No murmur, rubs or gallops noted. No JVD or BLE edema. No carotid bruits noted. Pulmonary/Chest: Normal effort and positive vesicular breath sounds. No respiratory distress. No wheezes, rales or ronchi noted.   Musculoskeletal: Normal flexion, extension, rotation and lateral bending of the cervical spine.  Pain with rotation of the cervical spine.  Bony tenderness noted over the cervical spine and left paracervical muscles.  Shoulder shrug equal.  Decreased external rotation of the left shoulder.  Normal internal rotation of the left shoulder.  Negative drop can test on the left.  Strength 4/5 LUE, 5/5 RUE.  Handgrips equal.  No difficulty with gait.  Neurological: Alert and oriented.  Coordination normal.  Psychiatric: Mood and affect normal. Behavior is normal. Judgment and thought content normal.    BMET    Component Value Date/Time   NA 140 10/03/2013 1426   K 4.3 10/03/2013 1426   CL 104 10/03/2013 1426   CO2 22 10/03/2013 1426   GLUCOSE 139 (H) 10/03/2013 1426   BUN 20 10/03/2013 1426   CREATININE 0.74 10/03/2013 1426   CALCIUM 9.0 10/03/2013 1426   GFRNONAA >90 10/03/2013 1426   GFRAA >90 10/03/2013 1426    Lipid Panel  No results found for: CHOL, TRIG, HDL, CHOLHDL, VLDL, LDLCALC  CBC    Component Value Date/Time   WBC 12.1 (H) 10/03/2013 1426   RBC 4.62 10/03/2013 1426   HGB 14.1 10/03/2013 1426   HCT 41.6 10/03/2013 1426   PLT 390 10/03/2013 1426   MCV 90.0 10/03/2013 1426   MCH 30.5 10/03/2013 1426   MCHC 33.9 10/03/2013 1426   RDW 12.5 10/03/2013 1426    Hgb A1C No results found for: HGBA1C          Assessment & Plan:  Neck pain, left arm pain, paresthesia left upper extremity:  DDx include blood clot of the left upper extremity, cervical radiculitis, left shoulder bursitis, left arm tendinitis Rx for Pred taper x 9 days Rx for Percocet 5-325 mg every 8 hours as needed Continue gabapentin  and cyclobenzaprine  as previously prescribed X-ray cervical spine and left shoulder today Will obtain stat venous ultrasound left upper extremity to rule out clot  RTC in 6 months for your annual exam Angeline Laura, NP

## 2023-11-26 NOTE — Patient Instructions (Signed)
 Cervical Radiculopathy  Cervical radiculopathy means that a nerve in the neck (a cervical nerve) is pinched or bruised. This can happen because of an injury to the cervical spine (vertebrae) in the neck, or as a normal part of getting older. This condition can cause pain or loss of feeling (numbness) that runs from your neck all the way down to your arm and fingers. Often, this condition gets better with rest. Treatment may be needed if the condition does not get better. What are the causes? A neck injury. A bulging disk in your spine. Sudden muscle tightening (muscle spasms). Tight muscles in your neck due to overuse. Arthritis. Breakdown in the bones and joints of the spine (spondylosis) due to getting older. Bone spurs that form near the nerves in the neck. What are the signs or symptoms? Pain. The pain may: Run from the neck to the arm and hand. Be very bad or irritating. Get worse when you move your neck. Loss of feeling or tingling in your arm or hand. Weakness in your arm or hand, in very bad cases. How is this treated? In many cases, treatment is not needed for this condition. With rest, the condition often gets better over time. If treatment is needed, options may include: Wearing a soft neck collar (cervical collar) for short periods of time. Doing exercises (physical therapy) to strengthen your neck muscles. Taking medicines. Having shots (injections) in your spine, in very bad cases. Having surgery. This may be needed if other treatments do not help. The type of surgery that is used will depend on the cause of your condition. Follow these instructions at home: If you have a soft neck collar: Wear it as told by your doctor. Take it off only as told by your doctor. Ask your doctor if you can take the collar off for cleaning and bathing. If you are allowed to take the collar off for cleaning or bathing: Follow instructions from your doctor about how to take off the collar  safely. Clean the collar by wiping it with mild soap and water and drying it completely. Take out any removable pads in the collar every 1-2 days. Wash them by hand with soap and water. Let them air-dry completely before you put them back in the collar. Check your skin under the collar for redness or sores. If you see any, tell your doctor. Managing pain     Take over-the-counter and prescription medicines only as told by your doctor. If told, put ice on the painful area. To do this: If you have a soft neck collar, take if off as told by your doctor. Put ice in a plastic bag. Place a towel between your skin and the bag. Leave the ice on for 20 minutes, 2-3 times a day. Take off the ice if your skin turns bright red. This is very important. If you cannot feel pain, heat, or cold, you have a greater risk of damage to the area. If using ice does not help, you can try using heat. Use the heat source that your doctor recommends, such as a moist heat pack or a heating pad. Place a towel between your skin and the heat source. Leave the heat on for 20-30 minutes. Take off the heat if your skin turns bright red. This is very important. If you cannot feel pain, heat, or cold, you have a greater risk of getting burned. You may try a gentle neck and shoulder rub (massage). Activity Rest as needed. Return  to your normal activities when your doctor says that it is safe. Do exercises as told by your doctor or physical therapist. You may have to avoid lifting. Ask your doctor how much you can safely lift. General instructions Use a flat pillow when you sleep. Do not drive while wearing a soft neck collar. If you do not have a soft neck collar, ask your doctor if it is safe to drive while your neck heals. Ask your doctor if you should avoid driving or using machines while you are taking your medicine. Do not smoke or use any products that contain nicotine or tobacco. If you need help quitting, ask your  doctor. Keep all follow-up visits. Contact a doctor if: Your condition does not get better with treatment. Get help right away if: Your pain gets worse and medicine does not help. You lose feeling or feel weak in your hand, arm, face, or leg. You have a high fever. Your neck is stiff. You cannot control when you poop or pee (have incontinence). You have trouble with walking, balance, or talking. Summary Cervical radiculopathy means that a nerve in the neck is pinched or bruised. A nerve can get pinched from a bulging disk, arthritis, an injury to the neck, or other causes. Symptoms include pain, tingling, or loss of feeling that goes from the neck to the arm or hand. Weakness in your arm or hand can happen in very bad cases. Treatment may include resting, wearing a soft neck collar, and doing exercises. You might need to take medicines for pain. In very bad cases, shots or surgery may be needed. This information is not intended to replace advice given to you by your health care provider. Make sure you discuss any questions you have with your health care provider. Document Revised: 08/23/2020 Document Reviewed: 08/23/2020 Elsevier Patient Education  2024 [employer].

## 2023-11-26 NOTE — Assessment & Plan Note (Addendum)
 Complicated by morbid obesity Encouraged regular stretching and core strengthening Continue gabapentin  1200 mg 3 times daily and cyclobenzaprine  milligrams daily as needed and tramadol  50 mg 4 times daily as needed She will continue to follow with sports medicine

## 2023-11-26 NOTE — Assessment & Plan Note (Signed)
 She will continue to follow with oncology at [facility]

## 2023-11-26 NOTE — Assessment & Plan Note (Signed)
 Continue topiramate  50 mg twice daily Continue sumatriptan  100 mg and rizatriptan  10 mg daily as needed for breakthrough She will continue to follow with neurology

## 2023-11-26 NOTE — Assessment & Plan Note (Signed)
 Continue horizant 600 mg at bedtime She will continue to follow with

## 2023-11-26 NOTE — Assessment & Plan Note (Signed)
 Encouraged diet and exercise for weight loss ?

## 2023-11-27 ENCOUNTER — Ambulatory Visit: Payer: Self-pay | Admitting: Internal Medicine

## 2023-11-27 ENCOUNTER — Ambulatory Visit (INDEPENDENT_AMBULATORY_CARE_PROVIDER_SITE_OTHER)

## 2023-11-27 DIAGNOSIS — M79602 Pain in left arm: Secondary | ICD-10-CM | POA: Diagnosis not present

## 2023-11-27 DIAGNOSIS — Z170 Estrogen receptor positive status [ER+]: Secondary | ICD-10-CM

## 2023-11-27 DIAGNOSIS — C50012 Malignant neoplasm of nipple and areola, left female breast: Secondary | ICD-10-CM

## 2024-01-22 ENCOUNTER — Ambulatory Visit: Admitting: Family Medicine

## 2024-01-22 ENCOUNTER — Encounter: Payer: Self-pay | Admitting: Family Medicine

## 2024-01-22 ENCOUNTER — Ambulatory Visit: Payer: Self-pay

## 2024-01-22 VITALS — BP 117/82 | HR 98 | Temp 98.4°F | Ht 67.0 in | Wt 245.2 lb

## 2024-01-22 DIAGNOSIS — L03011 Cellulitis of right finger: Secondary | ICD-10-CM

## 2024-01-22 MED ORDER — SULFAMETHOXAZOLE-TRIMETHOPRIM 800-160 MG PO TABS: 1.0000 | ORAL_TABLET | Freq: Two times a day (BID) | ORAL | 0 refills | Status: DC

## 2024-01-22 NOTE — Telephone Encounter (Signed)
 Scheduled with [facility]

## 2024-01-22 NOTE — Progress Notes (Signed)
 BP 117/82   Pulse 98   Temp 98.4 F (36.9 C) (Oral)   Ht 5' 7 (1.702 m)   Wt 245 lb 3.2 oz (111.2 kg)   SpO2 98%   BMI 38.40 kg/m    Subjective:    Patient ID: Sandra Rocha, female    DOB: 03-Nov-1975, 48 y.o.   MRN: [id number]  HPI: Sandra Rocha is a 48 y.o. female  Chief Complaint  Patient presents with   Hand Pain    Last Friday caught right thumb nail in a  blanket    THUMB PAIN Duration: 1 week Involved hand: right Mechanism of injury: pulled her nail off on a blanket Location: ulnar side of R thumb nail Onset: sudden Severity: severe  Quality: aching, sharp Frequency: constant Radiation: no Treatments attempted: peroxide, epsom salts, soaks, neosporin Relief with NSAIDs?: no Weakness: no Numbness: no Redness: yes Swelling:yes Bruising: no Fevers: no  Relevant past medical, surgical, family and social history reviewed and updated as indicated. Interim medical history since our last visit reviewed. Allergies and medications reviewed and updated.  Review of Systems  Constitutional: Negative.   Respiratory: Negative.    Cardiovascular: Negative.   Musculoskeletal: Negative.   Skin:  Positive for color change and wound. Negative for pallor and rash.  Neurological: Negative.     Per HPI unless specifically indicated above     Objective:    BP 117/82   Pulse 98   Temp 98.4 F (36.9 C) (Oral)   Ht 5' 7 (1.702 m)   Wt 245 lb 3.2 oz (111.2 kg)   SpO2 98%   BMI 38.40 kg/m   Wt Readings from Last 3 Encounters:  01/22/24 245 lb 3.2 oz (111.2 kg)  11/26/23 236 lb 9.6 oz (107.3 kg)  11/04/23 225 lb (102.1 kg)    Physical Exam Vitals and nursing note reviewed.  Constitutional:      General: She is not in acute distress.    Appearance: Normal appearance. She is not ill-appearing, toxic-appearing or diaphoretic.  HENT:     Head: Normocephalic and atraumatic.     Right Ear: External ear normal.     Left Ear: External ear normal.      Nose: Nose normal.     Mouth/Throat:     Mouth: Mucous membranes are moist.     Pharynx: Oropharynx is clear.  Eyes:     General: No scleral icterus.       Right eye: No discharge.        Left eye: No discharge.     Extraocular Movements: Extraocular movements intact.     Conjunctiva/sclera: Conjunctivae normal.     Pupils: Pupils are equal, round, and reactive to light.  Cardiovascular:     Rate and Rhythm: Normal rate and regular rhythm.     Pulses: Normal pulses.     Heart sounds: Normal heart sounds. No murmur heard.    No friction rub. No gallop.  Pulmonary:     Effort: Pulmonary effort is normal. No respiratory distress.     Breath sounds: Normal breath sounds. No stridor. No wheezing, rhonchi or rales.  Chest:     Chest wall: No tenderness.  Musculoskeletal:        General: Normal range of motion.     Cervical back: Normal range of motion and neck supple.  Skin:    General: Skin is warm and dry.     Capillary Refill: Capillary refill takes less than 2 seconds.  Coloration: Skin is not jaundiced or pale.     Findings: Erythema present. No bruising, lesion or rash.     Comments: Red, hot, swollen R ulnar border of thumb. No fluctuance, not evidence of abscess  Neurological:     General: No focal deficit present.     Mental Status: She is alert and oriented to person, place, and time. Mental status is at baseline.  Psychiatric:        Mood and Affect: Mood normal.        Behavior: Behavior normal.        Thought Content: Thought content normal.        Judgment: Judgment normal.     Results for orders placed or performed in visit on 04/18/15  Cytology - PAP   Collection Time: 04/18/15 12:00 AM  Result Value Ref Range   CYTOLOGY - PAP PAP RESULT       Assessment & Plan:   Problem List Items Addressed This Visit   None Visit Diagnoses       Paronychia of thumb, right    -  Primary   Will treat with bactrim . Call if not getting better or getting worse.  Nothing to drain today- follow up with PCP next week- make sure doesn't need to be drained   Relevant Medications   sulfamethoxazole -trimethoprim  (BACTRIM  DS) 800-160 MG tablet        Follow up plan: Return for Next week- recheck with PCPs office.

## 2024-01-22 NOTE — Telephone Encounter (Signed)
 FYI Only or Action Required?: Action required by provider: request for appointment.  Patient was last seen in primary care on 11/26/2023 by Antonette Angeline ORN, NP.  Called Nurse Triage reporting Hand Injury.  Symptoms began a week ago.  Interventions attempted: Rest, hydration, or home remedies.  Symptoms are: gradually worsening.  Triage Disposition: See PCP When Office is Open (Within 3 Days)  Patient/caregiver understands and will follow disposition?: Yes   Copied from CRM #[id number]. Topic: Clinical - Red Word Triage >> Jan 22, 2024  8:40 AM Ivette P wrote: [facility] that prompted transfer to Nurse Triage: infection - injury   finger nail rip along the side. Reason for Disposition  [1] After 3 days AND [2] pain not improved  Answer Assessment - Initial Assessment Questions 1. MECHANISM: How did the injury happen?      Fingernail, Hangnail Injury  2. ONSET: When did the injury happen? (e.g., minutes, hours ago)      A week ago  3. LOCATION: What part of the finger is injured? Is the nail damaged?      Right Hand; Thumb  4. APPEARANCE of the INJURY: What does the injury look like?      Appears to be infected; draining, swelling, redness  5. SEVERITY: Can you use the hand normally?  Can you bend your fingers into a ball and then fully open them?     Yes  6. SIZE: For cuts, bruises, or swelling, ask: How large is it? (e.g., inches or centimeters;  entire finger)      Doesn't extend past the nailbed  7. PAIN: Is there pain? If Yes, ask: How bad is the pain?  (Scale 0-10; or none, mild, moderate, severe)     Yes, moderate  8. TETANUS: For any breaks in the skin, ask: When was your last tetanus booster?     Believes a Tetanus has been received within the last 5-10 years.  9. OTHER SYMPTOMS: Do you have any other symptoms?     Preventing use of the hand  10. PREGNANCY: Is there any chance you are pregnant? When was your last menstrual period?        No and No  Protocols used: Finger Injury-A-AH

## 2024-01-26 ENCOUNTER — Ambulatory Visit: Admitting: Family Medicine

## 2024-01-27 ENCOUNTER — Ambulatory Visit: Admitting: Family Medicine

## 2024-01-27 ENCOUNTER — Encounter: Payer: Self-pay | Admitting: Family Medicine

## 2024-01-27 VITALS — BP 121/83 | HR 105 | Temp 98.0°F | Ht 67.0 in | Wt 235.4 lb

## 2024-01-27 DIAGNOSIS — L03011 Cellulitis of right finger: Secondary | ICD-10-CM

## 2024-01-27 DIAGNOSIS — R110 Nausea: Secondary | ICD-10-CM | POA: Diagnosis not present

## 2024-01-27 MED ORDER — ONDANSETRON 4 MG PO TBDP: 4.0000 mg | ORAL_TABLET | Freq: Three times a day (TID) | ORAL | 0 refills | Status: AC | PRN

## 2024-01-27 NOTE — Progress Notes (Signed)
 BP 121/83   Pulse (!) 105   Temp 98 F (36.7 C) (Oral)   Ht 5' 7 (1.702 m)   Wt 235 lb 6.4 oz (106.8 kg)   SpO2 98%   BMI 36.87 kg/m    Subjective:    Patient ID: Sandra Rocha, female    DOB: Nov 15, 1975, 48 y.o.   MRN: [id number]  HPI: Sandra Rocha is a 48 y.o. female  Chief Complaint  Patient presents with   Hand Injury    Right thumb.   Significantly better. Still a little red and a little swollen. Has been doing epsom salt soaks. No fevers. Has been really nauseous on the bactrim .   Relevant past medical, surgical, family and social history reviewed and updated as indicated. Interim medical history since our last visit reviewed. Allergies and medications reviewed and updated.  Review of Systems  Constitutional: Negative.   Respiratory: Negative.    Cardiovascular: Negative.   Musculoskeletal: Negative.   Skin:  Positive for color change. Negative for pallor, rash and wound.  Neurological: Negative.   Psychiatric/Behavioral: Negative.      Per HPI unless specifically indicated above     Objective:    BP 121/83   Pulse (!) 105   Temp 98 F (36.7 C) (Oral)   Ht 5' 7 (1.702 m)   Wt 235 lb 6.4 oz (106.8 kg)   SpO2 98%   BMI 36.87 kg/m   Wt Readings from Last 3 Encounters:  01/27/24 235 lb 6.4 oz (106.8 kg)  01/22/24 245 lb 3.2 oz (111.2 kg)  11/26/23 236 lb 9.6 oz (107.3 kg)    Physical Exam Vitals and nursing note reviewed.  Constitutional:      General: She is not in acute distress.    Appearance: Normal appearance. She is well-developed.  HENT:     Head: Normocephalic and atraumatic.     Right Ear: Hearing and external ear normal.     Left Ear: Hearing and external ear normal.     Nose: Nose normal.     Mouth/Throat:     Mouth: Mucous membranes are moist.     Pharynx: Oropharynx is clear.  Eyes:     General: Lids are normal. No scleral icterus.       Right eye: No discharge.        Left eye: No discharge.     Conjunctiva/sclera:  Conjunctivae normal.  Pulmonary:     Effort: Pulmonary effort is normal. No respiratory distress.  Musculoskeletal:        General: Normal range of motion.  Skin:    Coloration: Skin is not jaundiced or pale.     Findings: Erythema present. No bruising, lesion or rash.  Neurological:     General: No focal deficit present.     Mental Status: She is alert and oriented to person, place, and time. Mental status is at baseline.  Psychiatric:        Mood and Affect: Mood normal.        Speech: Speech normal.        Behavior: Behavior normal.        Thought Content: Thought content normal.        Judgment: Judgment normal.     Results for orders placed or performed in visit on 04/18/15  Cytology - PAP   Collection Time: 04/18/15 12:00 AM  Result Value Ref Range   CYTOLOGY - PAP PAP RESULT       Assessment &  Plan:   Problem List Items Addressed This Visit   None Visit Diagnoses       Paronychia of thumb, right    -  Primary   Significantly improved. Finish antibiotics. No need to drain. Call with any concerns.     Nausea       Rx for zofran  sent to her pharmacy. Call with any concerns.        Follow up plan: Return if symptoms worsen or fail to improve, for with PCP.

## 2024-02-18 ENCOUNTER — Ambulatory Visit: Admitting: Internal Medicine

## 2024-03-07 ENCOUNTER — Ambulatory Visit: Admitting: Internal Medicine

## 2024-03-07 ENCOUNTER — Encounter: Payer: Self-pay | Admitting: Internal Medicine

## 2024-03-07 VITALS — BP 122/82 | Ht 67.0 in | Wt 240.8 lb

## 2024-03-07 DIAGNOSIS — F32A Depression, unspecified: Secondary | ICD-10-CM

## 2024-03-07 DIAGNOSIS — Z170 Estrogen receptor positive status [ER+]: Secondary | ICD-10-CM

## 2024-03-07 DIAGNOSIS — Z23 Encounter for immunization: Secondary | ICD-10-CM

## 2024-03-07 DIAGNOSIS — F419 Anxiety disorder, unspecified: Secondary | ICD-10-CM

## 2024-03-07 DIAGNOSIS — C50912 Malignant neoplasm of unspecified site of left female breast: Secondary | ICD-10-CM | POA: Diagnosis not present

## 2024-03-07 MED ORDER — DULOXETINE HCL 60 MG PO CPEP: 60.0000 mg | ORAL_CAPSULE | Freq: Every day | ORAL | 0 refills | Status: AC

## 2024-03-07 NOTE — Progress Notes (Signed)
 "  Subjective:    Patient ID: Sandra Rocha, female    DOB: 11-28-1975, 49 y.o.   MRN: [id number]  HPI  Discussed the use of AI scribe software for clinical note transcription with the patient, who gave verbal consent to proceed.  Sandra Rocha is a 49 year old female with a history of left breast cancer who presents for a follow-up regarding pain management and anxiety. She is accompanied by her daughter, Sandra Rocha, who is a public relations account executive at [employer].  She is currently recovering from breast cancer and experiencing significant arm pain, described as nerve damage from her surgical intervention. She previously underwent an ultrasound to rule out a clot and an MRI. She has been managing her symptoms with physical therapy for lymphedema, which has helped with swelling and pain, although the pain can still be severe at times.  She is taking gabapentin  1200 mg three times a day for pain management, which she has been on for years, even during her pregnancy. The gabapentin  helps with her back pain but not with the arm pain. She has also been trying to reduce her use of tramadol , which she finds ineffective for her arm pain.  She is experiencing anxiety and sleep disturbances, which she attributes to her current medication. She feels tired, anxious, and unable to focus, describing a 'spiral' of symptoms. She has a history of taking Paxil , which she found effective in the past. She also reports poor sleep, which exacerbates her anxiety, and uses tizanidine occasionally for migraines.  Her cancer care coordinator recommended switching from citalopram to duloxetine .  She is experiencing symptoms of menopause and is currently being treated with tamoxifen for her breast cancer. Her breast cancer was estrogen positive, and she is not having periods, indicating menopause.       Review of Systems   Past Medical History:  Diagnosis Date   Anxiety    Cancer (HCC)    Depression    Headache(784.0)     Low back pain    Neuropathy     Current Outpatient Medications  Medication Sig Dispense Refill   Acetaminophen  (TYLENOL ) 325 MG CAPS Take by oral route as needed.     botulinum toxin Type A (BOTOX) 100 units SOLR injection 200U TO BE ADMINISTERED BY MD     citalopram (CELEXA) 40 MG tablet      gabapentin  (NEURONTIN ) 600 MG tablet Take 2 tabs (1200 mg) 3 times daily as needed 180 tablet 6   HORIZANT 600 MG TBCR TAKE 1 TABLET WITH FOOD EVERY 4 TO 5 HOURS BEFORE BEDTIME.     Magnesium Oxide -Mg Supplement 400 MG CAPS Take 1 tablet by mouth daily.     mometasone (ELOCON) 0.1 % ointment      Multiple Vitamins-Minerals (MULTIVITAMIN PO) Take by mouth daily.     naloxone (NARCAN) nasal spray 4 mg/0.1 mL 4 mg.     ondansetron  (ZOFRAN -ODT) 4 MG disintegrating tablet Take 1 tablet (4 mg total) by mouth every 8 (eight) hours as needed for nausea or vomiting. 20 tablet 0   oxyCODONE -acetaminophen  (PERCOCET/ROXICET) 5-325 MG tablet every 6 (six) hours as needed.     pimecrolimus (ELIDEL) 1 % cream      rizatriptan  (MAXALT -MLT) 10 MG disintegrating tablet Take 1 tablet (10 mg total) by mouth as needed. May repeat in 2 hours if needed 10 tablet 6   Roflumilast (ZORYVE) 0.3 % FOAM      sulfamethoxazole -trimethoprim  (BACTRIM  DS) 800-160 MG tablet Take 1  tablet by mouth 2 (two) times daily. 14 tablet 0   SUMAtriptan  (IMITREX ) 100 MG tablet Take 100 mg by mouth as directed.     tamoxifen (NOLVADEX) 20 MG tablet Take 20 mg by mouth daily.     Thiamine HCl (VITAMIN B-1 PO) Take by mouth daily.     tiZANidine (ZANAFLEX) 4 MG tablet as needed for muscle spasms.     topiramate  (TOPAMAX ) 50 MG tablet 1 (one) Tablet two times daily until topiramate  ER available     traMADol  (ULTRAM ) 50 MG tablet 2 tabs po tid (Patient taking differently: as needed. 2 tabs po tid) 180 tablet 3   triamcinolone (KENALOG) 0.025 % cream      No current facility-administered medications for this visit.    No Known  Allergies  Family History  Problem Relation Age of Onset   Migraines Mother    Depression Mother    Depression Father    Heart disease Father    Migraines Maternal Grandfather     Social History   Socioeconomic History   Marital status: Married    Spouse name: [name]   Number of children: 2   Years of education: college   Highest education level: Not on file  Occupational History    Employer: [employer]  Tobacco Use   Smoking status: Never   Smokeless tobacco: Never  Vaping Use   Vaping status: Never Used  Substance and Sexual Activity   Alcohol use: Never   Drug use: Never   Sexual activity: Yes  Other Topics Concern   Not on file  Social History Narrative   Patient lives at home with her husband Sandra Rocha)   Patient  Works full time IT TRAINER. [employer]    Education college.   Right handed.   Caffeine two cup of coffee daily one glass of sweet tea with dinner.   Social Drivers of Health   Tobacco Use: Low Risk (01/27/2024)   Patient History    Smoking Tobacco Use: Never    Smokeless Tobacco Use: Never    Passive Exposure: Not on file  Financial Resource Strain: Low Risk (05/28/2023)   [facility]   Overall Financial Resource Strain (CARDIA)    Difficulty of Paying Living Expenses: Not hard at all  Food Insecurity: No Food Insecurity (05/28/2023)   Received from [facility]   Epic    Within the past 12 months, you worried that your food would run out before you got the money to buy more.: Never true    Within the past 12 months, the food you bought just didn't last and you didn't have money to get more.: Never true  Transportation Needs: No Transportation Needs (05/28/2023)   Received from [facility] - Transportation    Lack of Transportation (Medical): No    Lack of Transportation (Non-Medical): No  Physical Activity: Not on file  Stress: Not on file  Social Connections: Not on file  Intimate Partner Violence: Not  At Risk (07/21/2023)   Received from [facility]   Epic    Within the last year, have you been afraid of your partner or ex-partner?: No    Within the last year, have you been humiliated or emotionally abused in other ways by your partner or ex-partner?: No    Within the last year, have you been kicked, hit, slapped, or otherwise physically hurt by your partner or ex-partner?: No    Within the last  year, have you been raped or forced to have any kind of sexual activity by your partner or ex-partner?: No  Depression (PHQ2-9): Medium Risk (01/22/2024)   Depression (PHQ2-9)    PHQ-2 Score: 9  Alcohol Screen: Not on file  Housing: Unknown (03/27/2023)   Received from [facility]   Epic    Unable to Pay for Housing in the Last Year: Not on file    Number of Times Moved in the Last Year: Not on file    At any time in the past 12 months, were you homeless or living in a shelter (including now)?: No  Utilities: Low Risk (05/28/2023)   Received from [facility]   Utilities    Within the past 12 months, have you been unable to get utilities(heat, electricity) when it was really needed?: No  Health Literacy: Not on file     Constitutional: Denies fever, malaise, fatigue, headache or abrupt weight changes.  HEENT: Denies eye pain, eye redness, ear pain, ringing in the ears, wax buildup, runny nose, nasal congestion, bloody nose, or sore throat. Respiratory: Denies difficulty breathing, shortness of breath, cough or sputum production.   Cardiovascular: Denies chest pain, chest tightness, palpitations or swelling in the hands or feet.  Gastrointestinal: Denies abdominal pain, bloating, constipation, diarrhea or blood in the stool.  GU: Denies urgency, frequency, pain with urination, burning sensation, blood in urine, odor or discharge. Musculoskeletal: Patient reports chronic low back pain neck pain and left arm pain and swelling.  Denies decrease in range of motion,  difficulty with gait.  Skin: Denies redness, rashes, lesions or ulcercations.  Neurological: Patient reports restless legs, insomnia.  Denies dizziness, difficulty with memory, difficulty with speech or problems with balance and coordination.  Psych: Patient has a history of anxiety and depression.  Denies SI/HI.  No other specific complaints in a complete review of systems (except as listed in HPI above).      Objective:   Physical Exam BP 122/82 (BP Location: Right Arm, Patient Position: Sitting, Cuff Size: Large)   Ht 5' 7 (1.702 m)   Wt 240 lb 12.8 oz (109.2 kg)   LMP 02/13/2024 (Approximate)   BMI 37.71 kg/m    Wt Readings from Last 3 Encounters:  01/27/24 235 lb 6.4 oz (106.8 kg)  01/22/24 245 lb 3.2 oz (111.2 kg)  11/26/23 236 lb 9.6 oz (107.3 kg)    General: Appears her stated age, obese, in NAD. Skin: Warm, dry and intact.  Cardiovascular: Normal rate and rhythm. S1,S2 noted.  No murmur, rubs or gallops noted.  Radial pulse 2+ on the left. Pulmonary/Chest: Normal effort and positive vesicular breath sounds. No respiratory distress. No wheezes, rales or ronchi noted.  Musculoskeletal: Decreased external rotation of the left shoulder.  Normal internal rotation of the left shoulder. Handgrips equal.  No difficulty with gait.  Neurological: Alert and oriented.  Coordination normal.  Psychiatric: Mood and affect normal. Behavior is normal. Judgment and thought content normal.    BMET    Component Value Date/Time   NA 140 10/03/2013 1426   K 4.3 10/03/2013 1426   CL 104 10/03/2013 1426   CO2 22 10/03/2013 1426   GLUCOSE 139 (H) 10/03/2013 1426   BUN 20 10/03/2013 1426   CREATININE 0.74 10/03/2013 1426   CALCIUM 9.0 10/03/2013 1426   GFRNONAA >90 10/03/2013 1426   GFRAA >90 10/03/2013 1426    Lipid Panel  No results found for: CHOL, TRIG, HDL, CHOLHDL, VLDL, LDLCALC  CBC    Component Value Date/Time   WBC 12.1 (H) 10/03/2013 1426   RBC 4.62  10/03/2013 1426   HGB 14.1 10/03/2013 1426   HCT 41.6 10/03/2013 1426   PLT 390 10/03/2013 1426   MCV 90.0 10/03/2013 1426   MCH 30.5 10/03/2013 1426   MCHC 33.9 10/03/2013 1426   RDW 12.5 10/03/2013 1426    Hgb A1C No results found for: HGBA1C          Assessment & Plan:  Assessment and Plan    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 Lymphedema improved with physical therapy. Gabapentin  may not be fully effective. - Switched citalopram to duloxetine , alternating doses for two weeks, then daily duloxetine . - Continue gabapentin  1200 mg 3 times daily. - Consider switching to pregabalin if pain persists. - Use pillows to avoid sleeping on the left side. - Hug a pillow at night to alleviate arm pressure.  Generalized anxiety disorder with depressive symptoms Anxiety and depressive symptoms exacerbated by poor sleep and post-cancer treatment stress. Current citalopram insufficient. Duloxetine  considered for dual action on pain and depression. Cognitive behavioral therapy recommended. - Switched citalopram to duloxetine  as outlined. - Consider trazodone for sleep if issues persist after one month. - Encouraged cognitive behavioral therapy for anxiety management.  Menopausal symptoms secondary to breast cancer therapy Menopausal symptoms likely exacerbated by tamoxifen. Non-estrogen hormone therapy considered due to history of estrogen-positive breast cancer. - Consider non-estrogen hormone therapy for menopausal symptoms.        RTC in 2 months for your annual exam Angeline Laura, NP  "

## 2024-03-07 NOTE — Patient Instructions (Signed)

## 2024-03-21 ENCOUNTER — Encounter: Payer: Self-pay | Admitting: Sports Medicine

## 2024-03-21 DIAGNOSIS — G8929 Other chronic pain: Secondary | ICD-10-CM

## 2024-03-21 DIAGNOSIS — M5416 Radiculopathy, lumbar region: Secondary | ICD-10-CM

## 2024-03-21 MED ORDER — TRAMADOL HCL 50 MG PO TABS: 100.0000 mg | ORAL_TABLET | Freq: Three times a day (TID) | ORAL | 0 refills | Status: AC | PRN

## 2024-04-01 ENCOUNTER — Ambulatory Visit: Admitting: Internal Medicine

## 2024-04-05 ENCOUNTER — Encounter: Payer: Self-pay | Admitting: Internal Medicine

## 2024-04-05 ENCOUNTER — Ambulatory Visit: Admitting: Internal Medicine

## 2024-04-05 VITALS — BP 124/80 | Ht 67.0 in | Wt 243.4 lb

## 2024-04-05 DIAGNOSIS — B372 Candidiasis of skin and nail: Secondary | ICD-10-CM | POA: Diagnosis not present

## 2024-04-05 MED ORDER — NYSTATIN 100000 UNIT/GM EX POWD: 1.0000 | Freq: Two times a day (BID) | CUTANEOUS | 2 refills | Status: AC

## 2024-04-05 NOTE — Patient Instructions (Signed)
 Irritation in the Skin Folds (Intertrigo): What to Know Intertrigo is skin irritation (inflammation) that happens in warm, moist areas of the body. The irritation can cause a rash and make skin raw and itchy. The rash is usually pink or red. It happens mostly between folds of skin or where skin rubs together, such as: In the armpits. Under the breasts. Under the belly. In the groin area. Around the butt area. Between the toes. This condition is not passed from person to person. What are the causes? Heat, moisture, rubbing, and not enough air movement. The condition can be made worse by: Sweat. Bacteria. A fungus, such as yeast. What increases the risk? Moisture in your skin folds. You are more likely to develop this condition if you: Are not able to move around. Live in a warm and moist climate. Are not able to control your pee (urine) or poop (stool). Wear splints, braces, or other medical devices. Are overweight. Have diabetes. What are the signs or symptoms? A pink or red skin rash in a skin fold or near a skin fold. Raw or scaly skin. Itching. A burning feeling. Bleeding. Leaking fluid. A bad smell. How is this treated? Cleaning and drying your skin. Taking an antibiotic medicine or using an antibiotic skin cream for a bacterial infection. Using an antifungal cream on your skin or taking pills for an infection that was caused by a fungus, such as yeast. Using a steroid ointment to stop the itching and irritation. Separating the skin fold with a clean cotton cloth to absorb moisture and allow air to flow into the area. Follow these instructions at home: Keep the affected area clean and dry. Do not scratch your skin. Stay cool as much as you can. Use an air conditioner or a fan, if you have one. Apply over-the-counter and prescription medicines only as told by your doctor. If you were prescribed antibiotics, use them as told by your doctor. Do not stop using the  antibiotic even if you start to feel better. Keep all follow-up visits. Your doctor may need to check your skin to make sure that the treatment is working. How is this prevented? Shower and dry yourself well after being active. Use a hair dryer on a cool setting to dry between skin folds. Do not wear tight clothes. Wear clothes that: Are loose. Take moisture away from your body. Are made of cotton. Wear a bra that gives good support, if needed. Protect the skin in your groin and butt area as told by your doctor. To do this: Follow a regular cleaning routine. Use creams, powders, or ointments that protect your skin. Change protection pads often. Stay at a healthy weight. Take care of your feet. This is very important if you have diabetes. You should: Wear shoes that fit well. Keep your feet dry. Wear clean cotton or wool socks. Keep your blood sugar under control if you have diabetes. Contact a doctor if: Your symptoms do not get better with treatment. Your symptoms get worse or they spread. You notice more redness and warmth. You have a fever. This information is not intended to replace advice given to you by your health care provider. Make sure you discuss any questions you have with your health care provider. Document Revised: 12/29/2023 Document Reviewed: 07/11/2021 Elsevier Patient Education  2025 [employer].

## 2024-05-17 ENCOUNTER — Encounter: Admitting: Internal Medicine
